# Patient Record
Sex: Female | Born: 1995
Health system: Southern US, Community
[De-identification: ages and names within clinical notes are randomized; demographics above are authoritative.]

## PROBLEM LIST (undated history)

## (undated) DIAGNOSIS — F988 Other specified behavioral and emotional disorders with onset usually occurring in childhood and adolescence: Secondary | ICD-10-CM

## (undated) DIAGNOSIS — J45909 Unspecified asthma, uncomplicated: Secondary | ICD-10-CM

## (undated) DIAGNOSIS — L732 Hidradenitis suppurativa: Secondary | ICD-10-CM

## (undated) HISTORY — DX: Hidradenitis suppurativa: L73.2

## (undated) HISTORY — DX: Unspecified asthma, uncomplicated: J45.909

## (undated) HISTORY — PX: NO PAST SURGERIES: SHX2092

## (undated) HISTORY — DX: Other specified behavioral and emotional disorders with onset usually occurring in childhood and adolescence: F98.8

---
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
## 2013-07-17 ENCOUNTER — Encounter: Payer: Self-pay | Admitting: Internal Medicine

## 2013-08-20 ENCOUNTER — Encounter: Payer: BC Managed Care – PPO | Admitting: Internal Medicine

## 2013-09-14 ENCOUNTER — Ambulatory Visit (INDEPENDENT_AMBULATORY_CARE_PROVIDER_SITE_OTHER): Payer: BC Managed Care – PPO | Admitting: Internal Medicine

## 2013-09-14 ENCOUNTER — Encounter: Payer: Self-pay | Admitting: Internal Medicine

## 2013-09-14 VITALS — BP 110/70 | HR 107 | Temp 98.5°F | Ht 63.0 in | Wt 168.2 lb

## 2013-09-14 DIAGNOSIS — F909 Attention-deficit hyperactivity disorder, unspecified type: Secondary | ICD-10-CM

## 2013-09-14 DIAGNOSIS — Z13220 Encounter for screening for lipoid disorders: Secondary | ICD-10-CM

## 2013-09-14 DIAGNOSIS — R002 Palpitations: Secondary | ICD-10-CM

## 2013-09-14 DIAGNOSIS — R040 Epistaxis: Secondary | ICD-10-CM

## 2013-09-14 LAB — COMPREHENSIVE METABOLIC PANEL
ALK PHOS: 64 U/L (ref 47–119)
ALT: 16 U/L (ref 0–35)
AST: 24 U/L (ref 0–37)
Albumin: 4.2 g/dL (ref 3.5–5.2)
BILIRUBIN TOTAL: 0.5 mg/dL (ref 0.2–1.2)
BUN: 11 mg/dL (ref 6–23)
CO2: 24 meq/L (ref 19–32)
CREATININE: 0.5 mg/dL (ref 0.4–1.2)
Calcium: 9.8 mg/dL (ref 8.4–10.5)
Chloride: 107 mEq/L (ref 96–112)
GFR: 163.89 mL/min (ref >60.00)
Glucose, Bld: 77 mg/dL (ref 70–99)
Potassium: 3.8 mEq/L (ref 3.5–5.1)
Sodium: 140 mEq/L (ref 135–145)
Total Protein: 7.3 g/dL (ref 6.0–8.3)

## 2013-09-14 LAB — LIPID PANEL
CHOL/HDL RATIO: 3
Cholesterol: 159 mg/dL (ref 0–200)
HDL: 50 mg/dL (ref >39.00)
LDL Cholesterol: 102 mg/dL — ABNORMAL HIGH (ref 0–99)
NonHDL: 109
TRIGLYCERIDES: 37 mg/dL (ref 0.0–149.0)
VLDL: 7.4 mg/dL (ref 0.0–40.0)

## 2013-09-14 LAB — CBC WITH DIFFERENTIAL/PLATELET
BASOS ABS: 0.1 10*3/uL (ref 0.0–0.1)
Basophils Relative: 0.8 % (ref 0.0–3.0)
Eosinophils Absolute: 0 10*3/uL (ref 0.0–0.7)
Eosinophils Relative: 0.2 % (ref 0.0–5.0)
HEMATOCRIT: 35.4 % — AB (ref 36.0–49.0)
HEMOGLOBIN: 11.7 g/dL — AB (ref 12.0–16.0)
LYMPHS ABS: 2 10*3/uL (ref 0.7–4.0)
Lymphocytes Relative: 19.3 % — ABNORMAL LOW (ref 24.0–48.0)
MCHC: 33 g/dL (ref 31.0–37.0)
MCV: 81.4 fl (ref 78.0–98.0)
MONO ABS: 0.7 10*3/uL (ref 0.1–1.0)
MONOS PCT: 6.6 % (ref 3.0–12.0)
NEUTROS ABS: 7.4 10*3/uL (ref 1.4–7.7)
Neutrophils Relative %: 73.1 % — ABNORMAL HIGH (ref 43.0–71.0)
PLATELETS: 338 10*3/uL (ref 150.0–575.0)
RBC: 4.35 Mil/uL (ref 3.80–5.70)
RDW: 14.3 % (ref 11.4–15.5)
WBC: 10.1 10*3/uL (ref 4.5–13.5)

## 2013-09-14 LAB — TSH: TSH: 2.31 u[IU]/mL (ref 0.40–5.00)

## 2013-09-14 NOTE — Progress Notes (Signed)
Pre visit review using our clinic review tool, if applicable. No additional management support is needed unless otherwise documented below in the visit note. 

## 2013-09-17 ENCOUNTER — Other Ambulatory Visit: Payer: Self-pay | Admitting: Internal Medicine

## 2013-09-17 ENCOUNTER — Encounter: Payer: Self-pay | Admitting: *Deleted

## 2013-09-17 DIAGNOSIS — D649 Anemia, unspecified: Secondary | ICD-10-CM

## 2013-09-17 NOTE — Progress Notes (Signed)
Orders placed for f/u labs.  

## 2013-09-18 ENCOUNTER — Encounter: Payer: Self-pay | Admitting: Internal Medicine

## 2013-09-18 DIAGNOSIS — R002 Palpitations: Secondary | ICD-10-CM | POA: Insufficient documentation

## 2013-09-18 NOTE — Assessment & Plan Note (Signed)
Appears to be stable.  Is home schooled.

## 2013-09-18 NOTE — Progress Notes (Signed)
  Subjective:    Patient ID: Julie Hester, female    DOB: Dec 20, 1995, 18 y.o.   MRN: [id number]  HPI 18 year old female with past history of Attention Deficit Disorder. She comes in today to follow up on these issues as well as for her complete physical exam.   States she is doing well.  Breathing stable.  No significant problems with allergies or sinus issues.  No acid reflux.  Eating and drinking well.  Watching her diet. Decreased gluten.   Has lost some weight.  Feels better.  No further nose bleeds.  Periods regular.  Last 4-7 days.  LMP ended two days ago.  Overall she feels she is doing well.  Not sexually active.     Past Medical History  Diagnosis Date  . Asthma     As a child  . Attention deficit disorder     Review of Systems Patient denies any headache, lightheadedness or dizziness.  No further nose bleeds.  No chest pain or tightness.  No palpitations.   No increased shortness of breath, cough or congestion.  No nausea or vomiting.  No acid reflux.  No abdominal pain or cramping.  No bowel change, such as diarrhea, constipation, BRBPR or melana.  No urine change.  Periods regular.        Objective:   Physical Exam   Filed Vitals:   09/14/13 1114  BP: 110/70  Pulse: 107  Temp: 98.5 F (4836.649 C)   18 year old female in no acute distress.   HEENT:  Nares- clear.  Oropharynx - without lesions. NECK:  Supple.  Nontender.  No audible bruit.  HEART:  Appears to be regular. LUNGS:  No crackles or wheezing audible.  Respirations even and unlabored.  RADIAL PULSE:  Equal bilaterally.    BREASTS:  No nipple discharge or nipple retraction present.  Could not appreciate any distinct nodules or axillary adenopathy.  ABDOMEN:  Soft, nontender.  Bowel sounds present and normal.  No audible abdominal bruit.  GU:  Not performed.     EXTREMITIES:  No increased edema present.  DP pulses palpable and equal bilaterally.           Assessment & Plan:  CARDIOVASCULAR.  No  palpitations.  Currently asymptomatic.    PULMONARY.  Breathing stable.   WEIGHT LOSS.  Has adjusted her diet.  Lost weight.  Has decreased gluten intake.     HEALTH MAINTENANCE.  Check routine labs.  Physical today.    I spent 25 minutes with the patient and more than 50% of the time was spent in consultation regarding the above.

## 2013-09-18 NOTE — Assessment & Plan Note (Signed)
Not an issue now.  Follow.    

## 2013-10-03 ENCOUNTER — Other Ambulatory Visit: Payer: BC Managed Care – PPO

## 2013-10-03 ENCOUNTER — Other Ambulatory Visit (INDEPENDENT_AMBULATORY_CARE_PROVIDER_SITE_OTHER): Payer: BC Managed Care – PPO

## 2013-10-03 DIAGNOSIS — D649 Anemia, unspecified: Secondary | ICD-10-CM

## 2013-10-03 LAB — CBC WITH DIFFERENTIAL/PLATELET
BASOS ABS: 0 10*3/uL (ref 0.0–0.1)
Basophils Relative: 0.4 % (ref 0.0–3.0)
EOS ABS: 0.1 10*3/uL (ref 0.0–0.7)
Eosinophils Relative: 0.9 % (ref 0.0–5.0)
HCT: 36 % (ref 36.0–49.0)
Hemoglobin: 11.9 g/dL — ABNORMAL LOW (ref 12.0–16.0)
Lymphocytes Relative: 29.3 % (ref 24.0–48.0)
Lymphs Abs: 2.5 10*3/uL (ref 0.7–4.0)
MCHC: 33 g/dL (ref 31.0–37.0)
MCV: 81.6 fl (ref 78.0–98.0)
Monocytes Absolute: 0.6 10*3/uL (ref 0.1–1.0)
Monocytes Relative: 7.2 % (ref 3.0–12.0)
NEUTROS PCT: 62.2 % (ref 43.0–71.0)
Neutro Abs: 5.3 10*3/uL (ref 1.4–7.7)
PLATELETS: 287 10*3/uL (ref 150.0–575.0)
RBC: 4.41 Mil/uL (ref 3.80–5.70)
RDW: 14.4 % (ref 11.4–15.5)
WBC: 8.5 10*3/uL (ref 4.5–13.5)

## 2013-10-04 LAB — IBC PANEL
Iron: 36 ug/dL — ABNORMAL LOW (ref 42–145)
Saturation Ratios: 8 % — ABNORMAL LOW (ref 20.0–50.0)
TRANSFERRIN: 321.1 mg/dL (ref 212.0–360.0)

## 2013-10-04 LAB — FERRITIN: Ferritin: 9 ng/mL — ABNORMAL LOW (ref 10.0–291.0)

## 2013-10-07 ENCOUNTER — Other Ambulatory Visit: Payer: Self-pay | Admitting: Internal Medicine

## 2013-10-07 DIAGNOSIS — D649 Anemia, unspecified: Secondary | ICD-10-CM

## 2013-10-07 NOTE — Progress Notes (Signed)
Order placed for f/u labs.  

## 2013-10-08 ENCOUNTER — Encounter: Payer: Self-pay | Admitting: *Deleted

## 2013-10-18 ENCOUNTER — Ambulatory Visit: Payer: BC Managed Care – PPO | Admitting: Internal Medicine

## 2013-11-19 ENCOUNTER — Encounter: Payer: Self-pay | Admitting: Internal Medicine

## 2013-11-19 ENCOUNTER — Other Ambulatory Visit: Payer: Self-pay | Admitting: Internal Medicine

## 2013-11-19 ENCOUNTER — Other Ambulatory Visit (INDEPENDENT_AMBULATORY_CARE_PROVIDER_SITE_OTHER): Payer: BC Managed Care – PPO

## 2013-11-19 DIAGNOSIS — D649 Anemia, unspecified: Secondary | ICD-10-CM

## 2013-11-19 LAB — CBC WITH DIFFERENTIAL/PLATELET
Basophils Absolute: 0 10*3/uL (ref 0.0–0.1)
Basophils Relative: 0.2 % (ref 0.0–3.0)
EOS ABS: 0.1 10*3/uL (ref 0.0–0.7)
Eosinophils Relative: 1 % (ref 0.0–5.0)
HCT: 35.3 % — ABNORMAL LOW (ref 36.0–49.0)
Hemoglobin: 11.7 g/dL — ABNORMAL LOW (ref 12.0–16.0)
LYMPHS ABS: 2.4 10*3/uL (ref 0.7–4.0)
Lymphocytes Relative: 20.6 % — ABNORMAL LOW (ref 24.0–48.0)
MCHC: 33.3 g/dL (ref 31.0–37.0)
MCV: 82.7 fl (ref 78.0–98.0)
MONO ABS: 0.8 10*3/uL (ref 0.1–1.0)
Monocytes Relative: 6.8 % (ref 3.0–12.0)
NEUTROS PCT: 71.4 % — AB (ref 43.0–71.0)
Neutro Abs: 8.2 10*3/uL — ABNORMAL HIGH (ref 1.4–7.7)
Platelets: 330 10*3/uL (ref 150.0–575.0)
RBC: 4.27 Mil/uL (ref 3.80–5.70)
RDW: 15.1 % (ref 11.4–15.5)
WBC: 11.4 10*3/uL (ref 4.5–13.5)

## 2013-11-19 LAB — FERRITIN: FERRITIN: 15.5 ng/mL (ref 10.0–291.0)

## 2013-11-19 NOTE — Progress Notes (Signed)
Order placed for f/u labs.  

## 2013-11-20 ENCOUNTER — Encounter: Payer: Self-pay | Admitting: *Deleted

## 2013-11-21 NOTE — Telephone Encounter (Signed)
Mailed unread message to pt  

## 2014-01-07 ENCOUNTER — Other Ambulatory Visit (INDEPENDENT_AMBULATORY_CARE_PROVIDER_SITE_OTHER): Payer: BC Managed Care – PPO

## 2014-01-07 DIAGNOSIS — D649 Anemia, unspecified: Secondary | ICD-10-CM

## 2014-01-07 LAB — CBC WITH DIFFERENTIAL/PLATELET
Basophils Absolute: 0 10*3/uL (ref 0.0–0.1)
Basophils Relative: 0.5 % (ref 0.0–3.0)
EOS ABS: 0.1 10*3/uL (ref 0.0–0.7)
EOS PCT: 0.7 % (ref 0.0–5.0)
HCT: 35.8 % — ABNORMAL LOW (ref 36.0–46.0)
Hemoglobin: 12.1 g/dL (ref 12.0–15.0)
Lymphocytes Relative: 25.2 % (ref 12.0–46.0)
Lymphs Abs: 2.4 10*3/uL (ref 0.7–4.0)
MCHC: 33.7 g/dL (ref 30.0–36.0)
MCV: 83.2 fl (ref 78.0–100.0)
MONO ABS: 0.6 10*3/uL (ref 0.1–1.0)
Monocytes Relative: 6.3 % (ref 3.0–12.0)
Neutro Abs: 6.3 10*3/uL (ref 1.4–7.7)
Neutrophils Relative %: 67.3 % (ref 43.0–77.0)
PLATELETS: 336 10*3/uL (ref 150.0–400.0)
RBC: 4.3 Mil/uL (ref 3.87–5.11)
RDW: 15.1 % — ABNORMAL HIGH (ref 11.5–14.6)
WBC: 9.4 10*3/uL (ref 4.5–10.5)

## 2014-01-07 LAB — FERRITIN: FERRITIN: 19.2 ng/mL (ref 10.0–291.0)

## 2014-01-08 ENCOUNTER — Encounter: Payer: Self-pay | Admitting: Internal Medicine

## 2014-01-08 ENCOUNTER — Other Ambulatory Visit: Payer: Self-pay | Admitting: Internal Medicine

## 2014-01-08 ENCOUNTER — Encounter: Payer: Self-pay | Admitting: *Deleted

## 2014-01-08 DIAGNOSIS — D509 Iron deficiency anemia, unspecified: Secondary | ICD-10-CM

## 2014-01-08 NOTE — Progress Notes (Signed)
Orders placed for f/u labs.  

## 2014-03-11 ENCOUNTER — Other Ambulatory Visit (INDEPENDENT_AMBULATORY_CARE_PROVIDER_SITE_OTHER): Payer: BC Managed Care – PPO

## 2014-03-11 DIAGNOSIS — D509 Iron deficiency anemia, unspecified: Secondary | ICD-10-CM

## 2014-03-11 LAB — CBC WITH DIFFERENTIAL/PLATELET
Basophils Absolute: 0 10*3/uL (ref 0.0–0.1)
Basophils Relative: 0.2 % (ref 0.0–3.0)
EOS PCT: 1.4 % (ref 0.0–5.0)
Eosinophils Absolute: 0.1 10*3/uL (ref 0.0–0.7)
HEMATOCRIT: 37.6 % (ref 36.0–46.0)
Hemoglobin: 12.4 g/dL (ref 12.0–15.0)
Lymphocytes Relative: 25 % (ref 12.0–46.0)
Lymphs Abs: 2.6 10*3/uL (ref 0.7–4.0)
MCHC: 33 g/dL (ref 30.0–36.0)
MCV: 84.3 fl (ref 78.0–100.0)
MONOS PCT: 6.6 % (ref 3.0–12.0)
Monocytes Absolute: 0.7 10*3/uL (ref 0.1–1.0)
NEUTROS PCT: 66.8 % (ref 43.0–77.0)
Neutro Abs: 6.8 10*3/uL (ref 1.4–7.7)
Platelets: 348 10*3/uL (ref 150.0–400.0)
RBC: 4.46 Mil/uL (ref 3.87–5.11)
RDW: 14.5 % (ref 11.5–14.6)
WBC: 10.3 10*3/uL (ref 4.5–10.5)

## 2014-03-11 LAB — FERRITIN: FERRITIN: 19.2 ng/mL (ref 10.0–291.0)

## 2014-03-12 ENCOUNTER — Telehealth: Payer: Self-pay | Admitting: Internal Medicine

## 2014-03-12 ENCOUNTER — Encounter: Payer: Self-pay | Admitting: Internal Medicine

## 2014-03-12 NOTE — Telephone Encounter (Signed)
Pt needs a physical scheduled after 09/15/14.  Thanks.

## 2014-03-15 NOTE — Telephone Encounter (Signed)
Unread mychart message mailed to patient 

## 2014-09-17 ENCOUNTER — Encounter: Payer: Self-pay | Admitting: Internal Medicine

## 2014-09-17 ENCOUNTER — Ambulatory Visit (INDEPENDENT_AMBULATORY_CARE_PROVIDER_SITE_OTHER): Payer: BLUE CROSS/BLUE SHIELD | Admitting: Internal Medicine

## 2014-09-17 VITALS — BP 110/60 | HR 81 | Temp 98.4°F | Ht 63.5 in | Wt 183.0 lb

## 2014-09-17 DIAGNOSIS — Z0000 Encounter for general adult medical examination without abnormal findings: Secondary | ICD-10-CM | POA: Diagnosis not present

## 2014-09-17 DIAGNOSIS — D509 Iron deficiency anemia, unspecified: Secondary | ICD-10-CM | POA: Insufficient documentation

## 2014-09-17 DIAGNOSIS — Z13220 Encounter for screening for lipoid disorders: Secondary | ICD-10-CM

## 2014-09-17 DIAGNOSIS — F909 Attention-deficit hyperactivity disorder, unspecified type: Secondary | ICD-10-CM

## 2014-09-17 DIAGNOSIS — R002 Palpitations: Secondary | ICD-10-CM

## 2014-09-17 DIAGNOSIS — R040 Epistaxis: Secondary | ICD-10-CM | POA: Diagnosis not present

## 2014-09-17 LAB — COMPREHENSIVE METABOLIC PANEL
ALT: 16 U/L (ref 0–35)
AST: 20 U/L (ref 0–37)
Albumin: 4.4 g/dL (ref 3.5–5.2)
Alkaline Phosphatase: 64 U/L (ref 39–117)
BUN: 9 mg/dL (ref 6–23)
CHLORIDE: 104 meq/L (ref 96–112)
CO2: 26 mEq/L (ref 19–32)
CREATININE: 0.54 mg/dL (ref 0.40–1.20)
Calcium: 9.7 mg/dL (ref 8.4–10.5)
GFR: 151.88 mL/min (ref >60.00)
GLUCOSE: 87 mg/dL (ref 70–99)
Potassium: 4 mEq/L (ref 3.5–5.1)
Sodium: 137 mEq/L (ref 135–145)
Total Bilirubin: 0.5 mg/dL (ref 0.2–1.2)
Total Protein: 6.9 g/dL (ref 6.0–8.3)

## 2014-09-17 LAB — CBC WITH DIFFERENTIAL/PLATELET
Basophils Absolute: 0 10*3/uL (ref 0.0–0.1)
Basophils Relative: 0.3 % (ref 0.0–3.0)
EOS ABS: 0 10*3/uL (ref 0.0–0.7)
Eosinophils Relative: 0.6 % (ref 0.0–5.0)
HCT: 37.3 % (ref 36.0–46.0)
Hemoglobin: 12.5 g/dL (ref 12.0–15.0)
LYMPHS PCT: 20.5 % (ref 12.0–46.0)
Lymphs Abs: 1.8 10*3/uL (ref 0.7–4.0)
MCHC: 33.6 g/dL (ref 30.0–36.0)
MCV: 84.5 fl (ref 78.0–100.0)
Monocytes Absolute: 0.6 10*3/uL (ref 0.1–1.0)
Monocytes Relative: 7.1 % (ref 3.0–12.0)
NEUTROS ABS: 6.2 10*3/uL (ref 1.4–7.7)
Neutrophils Relative %: 71.5 % (ref 43.0–77.0)
PLATELETS: 317 10*3/uL (ref 150.0–400.0)
RBC: 4.42 Mil/uL (ref 3.87–5.11)
RDW: 14.7 % — AB (ref 11.5–14.6)
WBC: 8.6 10*3/uL (ref 4.5–10.5)

## 2014-09-17 LAB — LIPID PANEL
Cholesterol: 149 mg/dL (ref 0–200)
HDL: 48.9 mg/dL (ref >39.00)
LDL CALC: 85 mg/dL (ref 0–99)
NONHDL: 100.1
Total CHOL/HDL Ratio: 3
Triglycerides: 78 mg/dL (ref 0.0–149.0)
VLDL: 15.6 mg/dL (ref 0.0–40.0)

## 2014-09-17 LAB — TSH: TSH: 2.5 u[IU]/mL (ref 0.35–5.50)

## 2014-09-17 LAB — FERRITIN: Ferritin: 20.2 ng/mL (ref 10.0–291.0)

## 2014-09-17 NOTE — Progress Notes (Signed)
Pre visit review using our clinic review tool, if applicable. No additional management support is needed unless otherwise documented below in the visit note. 

## 2014-09-17 NOTE — Progress Notes (Signed)
Patient ID: Julie Hester, female   DOB: 1995-12-30, 19 y.o.   MRN: [id number]   Subjective:    Patient ID: Julie Hester, female    DOB: 1995-06-24, 19 y.o.   MRN: [id number]  HPI  Patient here to follow up on her current medical issues as well as for a complete physical exam.  She is two months away from getting her high school diploma.  She tries to stay active.  Started exercising recently.  No cardiac symptoms with increased activity or exertion.  Breathing stable.  Bowels stable.  LMP 09/11/14.  Regular.  No spotting.  Not sexually active.     Past Medical History  Diagnosis Date  . Asthma     As a child  . Attention deficit disorder     Review of Systems  Constitutional: Negative for appetite change and unexpected weight change.  HENT: Negative for congestion and sinus pressure.   Eyes: Negative for pain and visual disturbance.  Respiratory: Negative for cough, chest tightness and shortness of breath.   Cardiovascular: Negative for chest pain, palpitations and leg swelling.  Gastrointestinal: Negative for nausea, vomiting, abdominal pain and diarrhea.  Genitourinary: Negative for dysuria and difficulty urinating.  Musculoskeletal: Negative for back pain and joint swelling.  Skin: Negative for color change and rash.  Neurological: Negative for dizziness, light-headedness and headaches.  Hematological: Negative for adenopathy. Does not bruise/bleed easily.  Psychiatric/Behavioral: Negative for dysphoric mood and agitation.       Objective:    Physical Exam  Constitutional: She is oriented to person, place, and time. She appears well-developed and well-nourished.  HENT:  Nose: Nose normal.  Mouth/Throat: Oropharynx is clear and moist.  Eyes: Right eye exhibits no discharge. Left eye exhibits no discharge. No scleral icterus.  Neck: Neck supple. No thyromegaly present.  Cardiovascular: Normal rate and regular rhythm.   Pulmonary/Chest: Breath sounds normal. No  accessory muscle usage. No tachypnea. No respiratory distress. She has no decreased breath sounds. She has no wheezes. She has no rhonchi. Right breast exhibits no inverted nipple, no mass, no nipple discharge and no tenderness (no axillary adenopathy). Left breast exhibits no inverted nipple, no mass, no nipple discharge and no tenderness (no axilarry adenopathy).  Abdominal: Soft. Bowel sounds are normal. There is no tenderness.  Musculoskeletal: She exhibits no edema or tenderness.  Lymphadenopathy:    She has no cervical adenopathy.  Neurological: She is alert and oriented to person, place, and time.  Skin: Skin is warm. No rash noted.  Psychiatric: She has a normal mood and affect. Her behavior is normal.    BP 110/60 mmHg  Pulse 81  Temp(Src) 98.4 F (36.9 C) (Oral)  Ht 5' 3.5" (1.613 m)  Wt 183 lb (83.008 kg)  BMI 31.90 kg/m2  SpO2 98%  LMP 09/11/2014 (Exact Date) Wt Readings from Last 3 Encounters:  09/17/14 183 lb (83.008 kg)  09/14/13 168 lb 4 oz (76.318 kg) (91 %*, Z = 1.35)  08/17/12 156 lb 8 oz (70.988 kg) (87 %*, Z = 1.13)   * Growth percentiles are based on CDC 2-20 Years data.     Lab Results  Component Value Date   WBC 8.6 09/17/2014   HGB 12.5 09/17/2014   HCT 37.3 09/17/2014   PLT 317.0 09/17/2014   GLUCOSE 87 09/17/2014   CHOL 149 09/17/2014   TRIG 78.0 09/17/2014   HDL 48.90 09/17/2014   LDLCALC 85 09/17/2014   ALT 16 09/17/2014   AST 20 09/17/2014  NA 137 09/17/2014   K 4.0 09/17/2014   CL 104 09/17/2014   CREATININE 0.54 09/17/2014   BUN 9 09/17/2014   CO2 26 09/17/2014   TSH 2.50 09/17/2014   HGBA1C  07/09/2008    5.0 (NOTE)   The ADA recommends the following therapeutic goal for glycemic   control related to Hgb A1C measurement:   Goal of Therapy:   < 7.0% Hgb A1C   Reference: American Diabetes Association: Clinical Practice   Recommendations 2008, Diabetes Care,  2008, 31:(Suppl 1).       Assessment & Plan:   Problem List Items  Addressed This Visit    Attention deficit hyperactivity disorder    Is home schooled.  Stable.  Follow.  Planning to finish with high school courses soon.        Epistaxis    Reports no further nose bleeds.  Will notify me if a problem.        Health care maintenance    Physical today.  Wanted to postpone her pap until next year.  Check cholesterol with next fasting labs.       Iron deficiency anemia - Primary    Recheck cbc and ferritin with next labs.  Was on iron.        Relevant Orders   CBC with Differential/Platelet (Completed)   Comprehensive metabolic panel (Completed)   TSH (Completed)   Ferritin (Completed)   Palpitations    Not an issue for her now.  Follow.         Other Visit Diagnoses    Screening cholesterol level        Relevant Orders    Lipid panel (Completed)        Dale [place], MD

## 2014-09-18 ENCOUNTER — Encounter: Payer: Self-pay | Admitting: Internal Medicine

## 2014-09-20 NOTE — Telephone Encounter (Signed)
Unread mychart message mailed to patient 

## 2014-09-24 ENCOUNTER — Encounter: Payer: Self-pay | Admitting: Internal Medicine

## 2014-09-24 DIAGNOSIS — Z0000 Encounter for general adult medical examination without abnormal findings: Secondary | ICD-10-CM | POA: Insufficient documentation

## 2014-09-24 NOTE — Assessment & Plan Note (Signed)
Physical today.  Wanted to postpone her pap until next year.  Check cholesterol with next fasting labs.

## 2014-09-24 NOTE — Assessment & Plan Note (Signed)
Reports no further nose bleeds.  Will notify me if a problem.

## 2014-09-24 NOTE — Assessment & Plan Note (Signed)
Is home schooled.  Stable.  Follow.  Planning to finish with high school courses soon.

## 2014-09-24 NOTE — Assessment & Plan Note (Signed)
Not an issue for her now.  Follow.  

## 2014-09-24 NOTE — Assessment & Plan Note (Signed)
Recheck cbc and ferritin with next labs.  Was on iron.

## 2015-02-10 ENCOUNTER — Other Ambulatory Visit: Payer: Self-pay | Admitting: Internal Medicine

## 2015-02-10 MED ORDER — ALPRAZOLAM 0.25 MG PO TABS: 0.2500 mg | ORAL_TABLET | Freq: Every day | ORAL | Status: DC | PRN

## 2015-02-10 NOTE — Progress Notes (Signed)
rx sent in at pt's mother's request for xanax.  Taking without problems.

## 2015-06-26 ENCOUNTER — Ambulatory Visit (INDEPENDENT_AMBULATORY_CARE_PROVIDER_SITE_OTHER): Payer: BLUE CROSS/BLUE SHIELD | Admitting: Internal Medicine

## 2015-06-26 ENCOUNTER — Encounter: Payer: Self-pay | Admitting: Internal Medicine

## 2015-06-26 VITALS — BP 110/70 | HR 86 | Temp 97.5°F | Resp 18 | Ht 63.5 in | Wt 181.1 lb

## 2015-06-26 DIAGNOSIS — F419 Anxiety disorder, unspecified: Secondary | ICD-10-CM | POA: Diagnosis not present
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
## 2015-06-26 NOTE — Progress Notes (Signed)
Pre-visit discussion using our clinic review tool. No additional management support is needed unless otherwise documented below in the visit note.  

## 2015-06-28 ENCOUNTER — Encounter: Payer: Self-pay | Admitting: Internal Medicine

## 2015-06-28 DIAGNOSIS — F32A Depression, unspecified: Secondary | ICD-10-CM | POA: Insufficient documentation

## 2015-06-28 DIAGNOSIS — F339 Major depressive disorder, recurrent, unspecified: Secondary | ICD-10-CM | POA: Insufficient documentation

## 2015-06-28 DIAGNOSIS — F419 Anxiety disorder, unspecified: Secondary | ICD-10-CM | POA: Insufficient documentation

## 2015-06-28 NOTE — Assessment & Plan Note (Signed)
Increased anxiety as outlined.  Seeing a counselor weekly.  Feels has PTSD.  Discussed the need for psychiatry referral.  She declines at this time.  Discussed her current issues and anxiety at length.  Discussed treatment options.  She is due to see her counselor today.  Will d/w her.  Will have her counselor call me and we will discuss treatment options.  Pt and mother comfortable with this plan.  No suicidal ideations.

## 2016-03-22 ENCOUNTER — Encounter: Payer: Self-pay | Admitting: Family

## 2016-03-22 ENCOUNTER — Ambulatory Visit (INDEPENDENT_AMBULATORY_CARE_PROVIDER_SITE_OTHER): Payer: BLUE CROSS/BLUE SHIELD | Admitting: Family

## 2016-03-22 VITALS — BP 120/62 | HR 91 | Temp 98.6°F | Ht 63.5 in | Wt 198.8 lb

## 2016-03-22 DIAGNOSIS — F419 Anxiety disorder, unspecified: Secondary | ICD-10-CM

## 2016-03-22 DIAGNOSIS — D509 Iron deficiency anemia, unspecified: Secondary | ICD-10-CM

## 2016-03-22 MED ORDER — SERTRALINE HCL 50 MG PO TABS: 50.0000 mg | ORAL_TABLET | Freq: Every day | ORAL | 0 refills | Status: DC

## 2016-03-22 NOTE — Patient Instructions (Addendum)
Follow up in one week  I would [facility] for [facility] to [facility] anytime between 8am-3pm.  They have counseling and psychiatrists there who can see you right away.  They have walk ins on Monday, Wednesdays, and Fridays.  The [place] [address] [place], [place] [address].  Phone number is [phone].   [place] [facility] and [facility].   Please know that I am thinking about you.   Our hope is for gradual improvement of mood since starting medication; however this may take several weeks.   If you start to have unusual thoughts, thoughts of hurting yourself, or anyone else, please go immediately to the emergency department.    National Suicide Prevention Hotline - available 24 hours a day, 7 days a week.  (780)060-95051-512-253-5659  Major Depressive Disorder Major depressive disorder is a mental illness. It also may be called clinical depression or unipolar depression. Major depressive disorder usually causes feelings of sadness, hopelessness, or helplessness. Some people with this disorder do not feel particularly sad but lose interest in doing things they used to enjoy (anhedonia). Major depressive disorder also can cause physical symptoms. It can interfere with work, school, relationships, and other normal everyday activities. The disorder varies in severity but is longer lasting and more serious than the sadness we all feel from time to time in our lives. Major depressive disorder often is triggered by stressful life events or major life changes. Examples of these triggers include divorce, loss of your job or home, a move, and the death of a family member or close friend. Sometimes this disorder occurs for no obvious reason at all. People who have family members with major depressive disorder or bipolar disorder are at higher risk for developing this disorder, with or without life stressors. Major depressive disorder can occur at any age. It may occur just  once in your life (single episode major depressive disorder). It may occur multiple times (recurrent major depressive disorder). SYMPTOMS People with major depressive disorder have either anhedonia or depressed mood on nearly a daily basis for at least 2 weeks or longer. Symptoms of depressed mood include:  Feelings of sadness (blue or down in the dumps) or emptiness.  Feelings of hopelessness or helplessness.  Tearfulness or episodes of crying (may be observed by others).  Irritability (children and adolescents). In addition to depressed mood or anhedonia or both, people with this disorder have at least four of the following symptoms:  Difficulty sleeping or sleeping too much.   Significant change (increase or decrease) in appetite or weight.   Lack of energy or motivation.  Feelings of guilt and worthlessness.   Difficulty concentrating, remembering, or making decisions.  Unusually slow movement (psychomotor retardation) or restlessness (as observed by others).   Recurrent wishes for death, recurrent thoughts of self-harm (suicide), or a suicide attempt. People with major depressive disorder commonly have persistent negative thoughts about themselves, other people, and the world. People with severe major depressive disorder may experiencedistorted beliefs or perceptions about the world (psychotic delusions). They also may see or hear things that are not real (psychotic hallucinations). DIAGNOSIS Major depressive disorder is diagnosed through an assessment by your health care provider. Your health care provider will ask aboutaspects of your daily life, such as mood,sleep, and appetite, to see if you have the diagnostic symptoms of major depressive disorder. Your health care provider may ask about your medical history and use of alcohol or drugs, including  prescription medicines. Your health care provider also may do a physical exam and blood work. This is because certain medical  conditions and the use of certain substances can cause major depressive disorder-like symptoms (secondary depression). Your health care provider also may refer you to a mental health specialist for further evaluation and treatment. TREATMENT It is important to recognize the symptoms of major depressive disorder and seek treatment. The following treatments can be prescribed for this disorder:   Medicine. Antidepressant medicines usually are prescribed. Antidepressant medicines are thought to correct chemical imbalances in the brain that are commonly associated with major depressive disorder. Other types of medicine may be added if the symptoms do not respond to antidepressant medicines alone or if psychotic delusions or hallucinations occur.  Talk therapy. Talk therapy can be helpful in treating major depressive disorder by providing support, education, and guidance. Certain types of talk therapy also can help with negative thinking (cognitive behavioral therapy) and with relationship issues that trigger this disorder (interpersonal therapy). A mental health specialist can help determine which treatment is best for you. Most people with major depressive disorder do well with a combination of medicine and talk therapy. Treatments involving electrical stimulation of the brain can be used in situations with extremely severe symptoms or when medicine and talk therapy do not work over time. These treatments include electroconvulsive therapy, transcranial magnetic stimulation, and vagal nerve stimulation.   This information is not intended to replace advice given to you by your health care provider. Make sure you discuss any questions you have with your health care provider.   Document Released: 07/17/2012 Document Revised: 04/12/2014 Document Reviewed: 07/17/2012 Elsevier Interactive Patient Education [employer]2016 Elsevier Inc.

## 2016-03-22 NOTE — Assessment & Plan Note (Signed)
Pending lab work .

## 2016-03-22 NOTE — Progress Notes (Signed)
Pre visit review using our clinic review tool, if applicable. No additional management support is needed unless otherwise documented below in the visit note. 

## 2016-03-22 NOTE — Assessment & Plan Note (Addendum)
Worsening. Patient and mother preference to go ahead and start trial zoloft for GAD, PTSD. Discussed side effects of this medication, including suidicidality at great length. Also given information for walk in [facility] services for immediate psychiatrist consult due to complex h/o nightmares, PTSD. Follow up in week.
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
## 2016-03-23 LAB — CBC WITH DIFFERENTIAL/PLATELET
BASOS ABS: 0 10*3/uL (ref 0.0–0.1)
Basophils Relative: 0.2 % (ref 0.0–3.0)
EOS ABS: 0 10*3/uL (ref 0.0–0.7)
EOS PCT: 0.3 % (ref 0.0–5.0)
HCT: 38.9 % (ref 36.0–46.0)
HEMOGLOBIN: 12.8 g/dL (ref 12.0–15.0)
Lymphocytes Relative: 14.7 % (ref 12.0–46.0)
Lymphs Abs: 1.8 10*3/uL (ref 0.7–4.0)
MCHC: 32.9 g/dL (ref 30.0–36.0)
MCV: 83.4 fl (ref 78.0–100.0)
MONO ABS: 0.6 10*3/uL (ref 0.1–1.0)
Monocytes Relative: 5 % (ref 3.0–12.0)
NEUTROS PCT: 79.8 % — AB (ref 43.0–77.0)
Neutro Abs: 9.9 10*3/uL — ABNORMAL HIGH (ref 1.4–7.7)
Platelets: 391 10*3/uL (ref 150.0–400.0)
RBC: 4.66 Mil/uL (ref 3.87–5.11)
RDW: 14.4 % (ref 11.5–15.5)
WBC: 12.4 10*3/uL — AB (ref 4.0–10.5)

## 2016-03-23 LAB — B12 AND FOLATE PANEL
FOLATE: 12.4 ng/mL (ref >5.9)
VITAMIN B 12: 280 pg/mL (ref 211–911)

## 2016-04-01 ENCOUNTER — Ambulatory Visit: Payer: BLUE CROSS/BLUE SHIELD | Admitting: Family

## 2016-07-22 ENCOUNTER — Ambulatory Visit (INDEPENDENT_AMBULATORY_CARE_PROVIDER_SITE_OTHER): Payer: BLUE CROSS/BLUE SHIELD | Admitting: Internal Medicine

## 2016-07-22 ENCOUNTER — Encounter: Payer: Self-pay | Admitting: Internal Medicine

## 2016-07-22 VITALS — BP 120/80 | HR 86 | Temp 98.6°F | Resp 12 | Ht 64.0 in | Wt 211.0 lb

## 2016-07-22 DIAGNOSIS — R51 Headache: Secondary | ICD-10-CM | POA: Diagnosis not present

## 2016-07-22 DIAGNOSIS — D509 Iron deficiency anemia, unspecified: Secondary | ICD-10-CM

## 2016-07-22 DIAGNOSIS — R519 Headache, unspecified: Secondary | ICD-10-CM

## 2016-07-22 DIAGNOSIS — F419 Anxiety disorder, unspecified: Secondary | ICD-10-CM | POA: Diagnosis not present

## 2016-07-22 MED ORDER — BUSPIRONE HCL 5 MG PO TABS: 5.0000 mg | ORAL_TABLET | Freq: Every day | ORAL | 0 refills | Status: DC

## 2016-07-22 NOTE — Progress Notes (Signed)
Pre-visit discussion using our clinic review tool. No additional management support is needed unless otherwise documented below in the visit note.  

## 2016-07-22 NOTE — Progress Notes (Signed)
Patient ID: Julie Hester, female   DOB: Feb 07, 1996, 21 y.o.   MRN: [id number]   Subjective:    Patient ID: Julie Hester, female    DOB: Mar 14, 1996, 21 y.o.   MRN: [id number]  HPI  Patient here for a scheduled follow up.  She reports increased anxiety.  Has been under increased stress with family issues.  Discussed at length with her today.  Discussed psychiatry referral.  She is reluctant to take SSRIs.  Discussed buspar.  She was in agreement to try this.  She is eating.  Discussed diet and exercise. Bowels moving.  She does report having migraine headaches.  She reports one every 1-2 weeks.  Describes as feeling tight - frontal region.  Some nausea. Sensitive to sound.  Started 1-2 months ago.  She feels is related to increased stress.  Discussed with her regarding further w/up for headache, including scan.  She declines.     Past Medical History:  Diagnosis Date  . Asthma    As a child  . Attention deficit disorder    History reviewed. No pertinent surgical history. Family History  Problem Relation Age of Onset  . Arthritis Mother   . Diabetes Father   . Cancer Maternal Grandmother     lung  . Cancer Maternal Grandfather     lung  . Cancer Paternal Grandmother     lung  . Cancer Paternal Grandfather     lung   Social History   Social History  . Marital status: Single    Spouse name: N/A  . Number of children: N/A  . Years of education: N/A   Social History Main Topics  . Smoking status: Former Smoker    Types: Cigarettes  . Smokeless tobacco: Never Used  . Alcohol use No  . Drug use: No  . Sexual activity: No   Other Topics Concern  . None   Social History Narrative  . None    Outpatient Encounter Prescriptions as of 07/22/2016  Medication Sig  . ALPRAZolam (XANAX) 0.25 MG tablet Take 1 tablet (0.25 mg total) by mouth daily as needed for anxiety.  . busPIRone (BUSPAR) 5 MG tablet Take 1 tablet (5 mg total) by mouth at bedtime.  . [DISCONTINUED]  sertraline (ZOLOFT) 50 MG tablet Take 1 tablet (50 mg total) by mouth at bedtime. (Patient not taking: Reported on 07/22/2016)   No facility-administered encounter medications on file as of 07/22/2016.     Review of Systems  Constitutional: Negative for appetite change and unexpected weight change.  HENT: Negative for congestion and sinus pressure.   Respiratory: Negative for cough, chest tightness and shortness of breath.   Cardiovascular: Negative for chest pain, palpitations and leg swelling.  Gastrointestinal: Negative for abdominal pain, diarrhea and vomiting.       Some nausea related to headaches.   Genitourinary: Negative for difficulty urinating and dysuria.  Musculoskeletal: Negative for back pain and joint swelling.  Skin: Negative for color change and rash.  Neurological: Positive for headaches. Negative for dizziness.  Psychiatric/Behavioral:       Increased anxiety and stress as outlined.         Objective:    Physical Exam  Constitutional: She appears well-developed and well-nourished. No distress.  HENT:  Nose: Nose normal.  Mouth/Throat: Oropharynx is clear and moist.  Neck: Neck supple. No thyromegaly present.  Cardiovascular: Normal rate and regular rhythm.   Pulmonary/Chest: Breath sounds normal. No respiratory distress. She has no wheezes.  Abdominal:  Soft. Bowel sounds are normal. There is no tenderness.  Musculoskeletal: She exhibits no edema or tenderness.  Lymphadenopathy:    She has no cervical adenopathy.  Skin: No rash noted. No erythema.  Psychiatric: She has a normal mood and affect.    BP 120/80 (BP Location: Left Arm, Patient Position: Sitting, Cuff Size: Normal)   Pulse 86   Temp 98.6 F (37 C) (Oral)   Resp 12   Ht  (1.626 m)   Wt 211 lb (95.7 kg)   LMP 07/05/2016 (Approximate)   SpO2 98%   BMI 36.22 kg/m  Wt Readings from Last 3 Encounters:  07/22/16 211 lb (95.7 kg)  03/22/16 198 lb 12.8 oz (90.2 kg)  06/26/15 181 lb 2 oz  (82.2 kg)     Lab Results  Component Value Date   WBC 11.0 (H) 07/22/2016   HGB 12.7 07/22/2016   HCT 38.8 07/22/2016   PLT 376.0 07/22/2016   GLUCOSE 87 07/22/2016   CHOL 149 09/17/2014   TRIG 78.0 09/17/2014   HDL 48.90 09/17/2014   LDLCALC 85 09/17/2014   ALT 19 07/22/2016   AST 19 07/22/2016   NA 137 07/22/2016   K 4.1 07/22/2016   CL 103 07/22/2016   CREATININE 0.62 07/22/2016   BUN 9 07/22/2016   CO2 26 07/22/2016   TSH 3.05 07/22/2016   HGBA1C  07/09/2008    5.0 (NOTE)   The ADA recommends the following therapeutic goal for glycemic   control related to Hgb A1C measurement:   Goal of Therapy:   < 7.0% Hgb A1C   Reference: American Diabetes Association: Clinical Practice   Recommendations 2008, Diabetes Care,  2008, 31:(Suppl 1).       Assessment & Plan:   Problem List Items Addressed This Visit    Anxiety - Primary    Increased stress and anxiety as outlined.  Discussed with her today.  She is reluctant to take SSRIs.  Discussed buspar.  She is agreeable.  Also discussed psychiatry referral.  She reluctantly agreed.  Will place order for referral.        Relevant Medications   busPIRone (BUSPAR) 5 MG tablet   Other Relevant Orders   CBC with Differential/Platelet (Completed)   Comprehensive metabolic panel (Completed)   TSH (Completed)   Ambulatory referral to Psychiatry   Headache    Persistent intermittent headaches as outlined.  Discussed further evaluation and testing with her.  She declines.  Wants to monitor.  Will notify me if she changes her mind.        Iron deficiency anemia    Recheck cbc.            Dale Hunter, MD

## 2016-07-23 LAB — COMPREHENSIVE METABOLIC PANEL
ALT: 19 U/L (ref 0–35)
AST: 19 U/L (ref 0–37)
Albumin: 4.4 g/dL (ref 3.5–5.2)
Alkaline Phosphatase: 67 U/L (ref 39–117)
BILIRUBIN TOTAL: 0.4 mg/dL (ref 0.2–1.2)
BUN: 9 mg/dL (ref 6–23)
CHLORIDE: 103 meq/L (ref 96–112)
CO2: 26 meq/L (ref 19–32)
CREATININE: 0.62 mg/dL (ref 0.40–1.20)
Calcium: 9.8 mg/dL (ref 8.4–10.5)
GFR: 127.27 mL/min (ref >60.00)
Glucose, Bld: 87 mg/dL (ref 70–99)
Potassium: 4.1 mEq/L (ref 3.5–5.1)
SODIUM: 137 meq/L (ref 135–145)
Total Protein: 7.7 g/dL (ref 6.0–8.3)

## 2016-07-23 LAB — CBC WITH DIFFERENTIAL/PLATELET
Basophils Absolute: 0.3 10*3/uL — ABNORMAL HIGH (ref 0.0–0.1)
Basophils Relative: 2.7 % (ref 0.0–3.0)
EOS ABS: 0.1 10*3/uL (ref 0.0–0.7)
Eosinophils Relative: 0.7 % (ref 0.0–5.0)
HCT: 38.8 % (ref 36.0–46.0)
Hemoglobin: 12.7 g/dL (ref 12.0–15.0)
Lymphocytes Relative: 20.6 % (ref 12.0–46.0)
Lymphs Abs: 2.3 10*3/uL (ref 0.7–4.0)
MCHC: 32.8 g/dL (ref 30.0–36.0)
MCV: 85.8 fl (ref 78.0–100.0)
MONO ABS: 0.8 10*3/uL (ref 0.1–1.0)
Monocytes Relative: 7.4 % (ref 3.0–12.0)
NEUTROS ABS: 7.5 10*3/uL (ref 1.4–7.7)
Neutrophils Relative %: 68.6 % (ref 43.0–77.0)
PLATELETS: 376 10*3/uL (ref 150.0–400.0)
RBC: 4.52 Mil/uL (ref 3.87–5.11)
RDW: 14.4 % (ref 11.5–15.5)
WBC: 11 10*3/uL — ABNORMAL HIGH (ref 4.0–10.5)

## 2016-07-23 LAB — TSH: TSH: 3.05 u[IU]/mL (ref 0.35–4.50)

## 2016-07-25 ENCOUNTER — Other Ambulatory Visit: Payer: Self-pay | Admitting: Internal Medicine

## 2016-07-25 DIAGNOSIS — D72829 Elevated white blood cell count, unspecified: Secondary | ICD-10-CM

## 2016-07-25 NOTE — Progress Notes (Signed)
Order placed for f/u cbc.   

## 2016-07-26 ENCOUNTER — Telehealth: Payer: Self-pay

## 2016-07-26 NOTE — Telephone Encounter (Signed)
Left message to return call to our office.  

## 2016-07-26 NOTE — Telephone Encounter (Signed)
-----   Message from Dale [place], MD sent at 07/25/2016  8:33 AM EDT ----- Please call and notify pt that her white blood cell is slightly elevated.  This can just be a variant.  We will need to follow.  Hgb, thyroid test, kidney function tests and liver function tests are wnl.  Recheck cbc in one month.

## 2016-07-26 NOTE — Telephone Encounter (Signed)
Spoke to patient informed of lab app. Will call if any questions

## 2016-08-02 ENCOUNTER — Encounter: Payer: Self-pay | Admitting: Internal Medicine

## 2016-08-02 DIAGNOSIS — R51 Headache: Secondary | ICD-10-CM

## 2016-08-02 DIAGNOSIS — R519 Headache, unspecified: Secondary | ICD-10-CM | POA: Insufficient documentation

## 2016-08-02 NOTE — Assessment & Plan Note (Signed)
Recheck cbc.  

## 2016-08-02 NOTE — Assessment & Plan Note (Signed)
Increased stress and anxiety as outlined.  Discussed with her today.  She is reluctant to take SSRIs.  Discussed buspar.  She is agreeable.  Also discussed psychiatry referral.  She reluctantly agreed.  Will place order for referral.

## 2016-08-02 NOTE — Assessment & Plan Note (Signed)
Persistent intermittent headaches as outlined.  Discussed further evaluation and testing with her.  She declines.  Wants to monitor.  Will notify me if she changes her mind.

## 2016-08-25 ENCOUNTER — Other Ambulatory Visit (INDEPENDENT_AMBULATORY_CARE_PROVIDER_SITE_OTHER): Payer: BLUE CROSS/BLUE SHIELD

## 2016-08-25 DIAGNOSIS — D72829 Elevated white blood cell count, unspecified: Secondary | ICD-10-CM

## 2016-08-25 LAB — CBC WITH DIFFERENTIAL/PLATELET
BASOS ABS: 0.1 10*3/uL (ref 0.0–0.1)
Basophils Relative: 0.5 % (ref 0.0–3.0)
EOS ABS: 0.1 10*3/uL (ref 0.0–0.7)
Eosinophils Relative: 1 % (ref 0.0–5.0)
HCT: 37.3 % (ref 36.0–46.0)
HEMOGLOBIN: 12.2 g/dL (ref 12.0–15.0)
Lymphocytes Relative: 25.1 % (ref 12.0–46.0)
Lymphs Abs: 2.7 10*3/uL (ref 0.7–4.0)
MCHC: 32.8 g/dL (ref 30.0–36.0)
MCV: 85.4 fl (ref 78.0–100.0)
MONO ABS: 0.8 10*3/uL (ref 0.1–1.0)
Monocytes Relative: 7.5 % (ref 3.0–12.0)
Neutro Abs: 7.1 10*3/uL (ref 1.4–7.7)
Neutrophils Relative %: 65.9 % (ref 43.0–77.0)
Platelets: 352 10*3/uL (ref 150.0–400.0)
RBC: 4.37 Mil/uL (ref 3.87–5.11)
RDW: 14.7 % (ref 11.5–15.5)
WBC: 10.8 10*3/uL — AB (ref 4.0–10.5)

## 2016-08-26 ENCOUNTER — Encounter: Payer: Self-pay | Admitting: Internal Medicine

## 2016-09-29 ENCOUNTER — Ambulatory Visit: Payer: BLUE CROSS/BLUE SHIELD | Admitting: Internal Medicine

## 2016-12-13 ENCOUNTER — Ambulatory Visit: Payer: BLUE CROSS/BLUE SHIELD | Admitting: Internal Medicine

## 2017-01-28 ENCOUNTER — Encounter: Payer: Self-pay | Admitting: Internal Medicine

## 2017-01-28 ENCOUNTER — Ambulatory Visit (INDEPENDENT_AMBULATORY_CARE_PROVIDER_SITE_OTHER): Payer: BLUE CROSS/BLUE SHIELD | Admitting: Internal Medicine

## 2017-01-28 VITALS — BP 126/84 | HR 90 | Temp 98.6°F | Resp 14 | Ht 64.0 in | Wt 215.0 lb

## 2017-01-28 DIAGNOSIS — Z0000 Encounter for general adult medical examination without abnormal findings: Secondary | ICD-10-CM

## 2017-01-28 DIAGNOSIS — D509 Iron deficiency anemia, unspecified: Secondary | ICD-10-CM | POA: Diagnosis not present

## 2017-01-28 DIAGNOSIS — F419 Anxiety disorder, unspecified: Secondary | ICD-10-CM | POA: Diagnosis not present

## 2017-01-28 DIAGNOSIS — L732 Hidradenitis suppurativa: Secondary | ICD-10-CM | POA: Diagnosis not present

## 2017-01-28 DIAGNOSIS — F909 Attention-deficit hyperactivity disorder, unspecified type: Secondary | ICD-10-CM | POA: Diagnosis not present

## 2017-01-28 DIAGNOSIS — Z0001 Encounter for general adult medical examination with abnormal findings: Secondary | ICD-10-CM

## 2017-01-28 MED ORDER — CEPHALEXIN 500 MG PO CAPS: 500.0000 mg | ORAL_CAPSULE | Freq: Three times a day (TID) | ORAL | 0 refills | Status: DC

## 2017-01-28 NOTE — Progress Notes (Signed)
Patient ID: Julie Hester, female   DOB: [birth year], 21 y.o.   MRN: [id number]   Subjective:    Patient ID: Julie Hester, female    DOB: [birth year], 21 y.o.   MRN: [id number]  HPI  Patient here for her physical exam.  She is doing better.  Was feeling increased anxiety, but states this is better.  Sleeping better.  Is exercising.  States rides 4-7 miles per day for 4-5 days per week and has been for the last several weeks.  Feels better doing this.  No chest pain.  No sob.  No acid reflux.  No abdominal pain.  Bowels moving.  Never took to buspar.  Discussed with her today.  She feels she is doing better.     Past Medical History:  Diagnosis Date  . Asthma    As a child  . Attention deficit disorder    History reviewed. No pertinent surgical history. Family History  Problem Relation Age of Onset  . Arthritis Mother   . Diabetes Father   . Cancer Maternal Grandmother        lung  . Cancer Maternal Grandfather        lung  . Cancer Paternal Grandmother        lung  . Cancer Paternal Grandfather        lung   Social History   Social History  . Marital status: Single    Spouse name: N/A  . Number of children: N/A  . Years of education: N/A   Social History Main Topics  . Smoking status: Former Smoker    Types: Cigarettes  . Smokeless tobacco: Never Used  . Alcohol use No  . Drug use: No  . Sexual activity: No   Other Topics Concern  . None   Social History Narrative  . None    Outpatient Encounter Prescriptions as of 01/28/2017  Medication Sig  . ALPRAZolam (XANAX) 0.25 MG tablet Take 1 tablet (0.25 mg total) by mouth daily as needed for anxiety.  . [DISCONTINUED] busPIRone (BUSPAR) 5 MG tablet Take 1 tablet (5 mg total) by mouth at bedtime.  . cephALEXin (KEFLEX) 500 MG capsule Take 1 capsule (500 mg total) by mouth 3 (three) times daily.   No facility-administered encounter medications on file as of 01/28/2017.     Review of Systems    Constitutional: Negative for appetite change and unexpected weight change.  HENT: Negative for congestion and sinus pressure.   Eyes: Negative for pain and visual disturbance.  Respiratory: Negative for cough, chest tightness and shortness of breath.   Cardiovascular: Negative for chest pain and palpitations.  Gastrointestinal: Negative for abdominal pain, diarrhea, nausea and vomiting.  Genitourinary: Negative for difficulty urinating and dysuria.  Musculoskeletal: Negative for back pain and joint swelling.  Skin: Negative for color change and rash.  Neurological: Negative for dizziness, light-headedness and headaches.  Hematological: Negative for adenopathy. Does not bruise/bleed easily.  Psychiatric/Behavioral: Negative for agitation and dysphoric mood.       Anxiety better.         Objective:    Physical Exam  Constitutional: She is oriented to person, place, and time. She appears well-developed and well-nourished. No distress.  HENT:  Nose: Nose normal.  Mouth/Throat: Oropharynx is clear and moist.  Eyes: Right eye exhibits no discharge. Left eye exhibits no discharge. No scleral icterus.  Neck: Neck supple. No thyromegaly present.  Cardiovascular: Normal rate and regular rhythm.   Pulmonary/Chest: Breath sounds normal.  No accessory muscle usage. No tachypnea. No respiratory distress. She has no decreased breath sounds. She has no wheezes. She has no rhonchi. Right breast exhibits no inverted nipple, no mass, no nipple discharge and no tenderness (no axillary adenopathy). Left breast exhibits no inverted nipple, no mass, no nipple discharge and no tenderness (no axilarry adenopathy).  Abdominal: Soft. Bowel sounds are normal. There is no tenderness.  Musculoskeletal: She exhibits no edema or tenderness.  Lymphadenopathy:    She has no cervical adenopathy.  Neurological: She is alert and oriented to person, place, and time.  Skin: Skin is warm. No rash noted. No erythema.   Axillary lesions/cysts - right open - no evidence of infection.  Left with fullness and tenderness.  Some surrounding erythema.    Psychiatric: She has a normal mood and affect. Her behavior is normal.    BP 126/84 (BP Location: Left Arm, Patient Position: Sitting, Cuff Size: Normal)   Pulse 90   Temp 98.6 F (37 C) (Oral)   Resp 14   Ht 5\' 4"  (1.626 m)   Wt 215 lb (97.5 kg)   LMP 01/26/2017   SpO2 98%   BMI 36.90 kg/m  Wt Readings from Last 3 Encounters:  01/28/17 215 lb (97.5 kg)  07/22/16 211 lb (95.7 kg)  03/22/16 198 lb 12.8 oz (90.2 kg)     Lab Results  Component Value Date   WBC 10.8 (H) 08/25/2016   HGB 12.2 08/25/2016   HCT 37.3 08/25/2016   PLT 352.0 08/25/2016   GLUCOSE 87 07/22/2016   CHOL 149 09/17/2014   TRIG 78.0 09/17/2014   HDL 48.90 09/17/2014   LDLCALC 85 09/17/2014   ALT 19 07/22/2016   AST 19 07/22/2016   NA 137 07/22/2016   K 4.1 07/22/2016   CL 103 07/22/2016   CREATININE 0.62 07/22/2016   BUN 9 07/22/2016   CO2 26 07/22/2016   TSH 3.05 07/22/2016   HGBA1C  07/09/2008    5.0 (NOTE)   The ADA recommends the following therapeutic goal for glycemic   control related to Hgb A1C measurement:   Goal of Therapy:   < 7.0% Hgb A1C   Reference: American Diabetes Association: Clinical Practice   Recommendations 2008, Diabetes Care,  2008, 31:(Suppl 1).       Assessment & Plan:   Problem List Items Addressed This Visit    Anxiety    Has improved.  Discussed with her today.  She did not take buspar.  Desires not to take.  Follow.  Desires no further intervention.        Attention deficit hyperactivity disorder    Is home schooled.  Stable.        Health care maintenance    Physical today 01/28/17.  She declines pap smear.        Hydradenitis    Axillary lesions as outlined.  Concern over increased erythema and tenderness - left.  Warm compresses.  Keflex.  Call with update over the next few days.  Discussed surgery referral. She wants to  hold at this time.  Follow.        Iron deficiency anemia    Follow [facility].            Dale DurhamSCOTT, Kanyla Omeara, MD

## 2017-01-29 ENCOUNTER — Encounter: Payer: Self-pay | Admitting: Internal Medicine

## 2017-01-30 ENCOUNTER — Encounter: Payer: Self-pay | Admitting: Internal Medicine

## 2017-01-30 DIAGNOSIS — L732 Hidradenitis suppurativa: Secondary | ICD-10-CM | POA: Insufficient documentation

## 2017-01-30 NOTE — Addendum Note (Signed)
Addended by: Charm BargesSCOTT, Bryon Parker S on: 01/30/2017 06:14 PM   Modules accepted: Level of Service

## 2017-01-30 NOTE — Assessment & Plan Note (Signed)
Follow [facility].  

## 2017-01-30 NOTE — Assessment & Plan Note (Signed)
Axillary lesions as outlined.  Concern over increased erythema and tenderness - left.  Warm compresses.  Keflex.  Call with update over the next few days.  Discussed surgery referral. She wants to hold at this time.  Follow.

## 2017-01-30 NOTE — Assessment & Plan Note (Signed)
Has improved.  Discussed with her today.  She did not take buspar.  Desires not to take.  Follow.  Desires no further intervention.

## 2017-01-30 NOTE — Assessment & Plan Note (Signed)
Is home schooled.  Stable.

## 2017-01-30 NOTE — Assessment & Plan Note (Signed)
Physical today 01/28/17.  She declines pap smear.

## 2017-06-13 ENCOUNTER — Encounter: Payer: Self-pay | Admitting: Internal Medicine

## 2017-06-13 NOTE — Telephone Encounter (Signed)
I would really like to see her prior to prescribing medication.  Would like for her to schedule appt.

## 2017-06-21 ENCOUNTER — Telehealth: Payer: Self-pay | Admitting: Internal Medicine

## 2017-06-21 NOTE — Telephone Encounter (Signed)
Medication is not on patients list please advise.

## 2017-06-21 NOTE — Telephone Encounter (Signed)
Copied from CRM [id number]. Topic: Quick Communication - Rx Refill/Question >> Jun 21, 2017  2:07 PM Landry MellowFoltz, Melissa J wrote: Medication: zoloft   Has the patient contacted their pharmacy? No.   (Agent: If no, request that the patient contact the pharmacy for the refill.)   Preferred Pharmacy (with phone number or street name): [facility] [address]  Pt says she was on this before and wants to go back on it.  She does not want to wait until her April appt if she can get it sooner.     Agent: Please be advised that RX refills may take up to 3 business days. We ask that you follow-up with your pharmacy.

## 2017-06-23 NOTE — Telephone Encounter (Signed)
Patient returning [place] phone call. Please contact pt.

## 2017-06-23 NOTE — Telephone Encounter (Signed)
Please advise 

## 2017-06-23 NOTE — Telephone Encounter (Signed)
Patient stated she was ok to wait until April 25 at her scheduled appt to discuss starting zoloft

## 2017-06-23 NOTE — Telephone Encounter (Signed)
Left message to call back  

## 2017-07-28 ENCOUNTER — Encounter

## 2017-07-28 ENCOUNTER — Encounter: Payer: Self-pay | Admitting: Internal Medicine

## 2017-07-28 ENCOUNTER — Ambulatory Visit (INDEPENDENT_AMBULATORY_CARE_PROVIDER_SITE_OTHER): Payer: BLUE CROSS/BLUE SHIELD | Admitting: Internal Medicine

## 2017-07-28 DIAGNOSIS — D509 Iron deficiency anemia, unspecified: Secondary | ICD-10-CM | POA: Diagnosis not present

## 2017-07-28 DIAGNOSIS — F419 Anxiety disorder, unspecified: Secondary | ICD-10-CM | POA: Diagnosis not present

## 2017-07-28 DIAGNOSIS — F909 Attention-deficit hyperactivity disorder, unspecified type: Secondary | ICD-10-CM

## 2017-07-28 DIAGNOSIS — Z6836 Body mass index (BMI) 36.0-36.9, adult: Secondary | ICD-10-CM | POA: Diagnosis not present

## 2017-07-28 MED ORDER — SERTRALINE HCL 25 MG PO TABS: 25.0000 mg | ORAL_TABLET | Freq: Every day | ORAL | 1 refills | Status: DC

## 2017-07-28 NOTE — Progress Notes (Signed)
Patient ID: Julie Hester, female   DOB: 1995-05-02, 22 y.o.   MRN: [id number]   Subjective:    Patient ID: Julie Hester, female    DOB: 1996-02-16, 21 y.o.   MRN: [id number]  HPI  Patient here for a scheduled follow up.  She reports she is feeling better.  Doing better.  Still with increased anxiety, but feels is some better.  Does feel she needs to be on something to help level things out.  Wants to graduate.  Has started exercising.  Is biking.  Also walking.  No chest pain.  No sob.  No acid reflux.  No abdominal pain.  Bowels moving.  No urine change.  States she has been talking to a boy over the internet.  They have been communicating for a while.  Has not met him face to face.  Enjoying the communication.  Overall feels better.  Would like to start zoloft.      Past Medical History:  Diagnosis Date  . Asthma    As a child  . Attention deficit disorder    History reviewed. No pertinent surgical history. Family History  Problem Relation Age of Onset  . Arthritis Mother   . Diabetes Father   . Cancer Maternal Grandmother        lung  . Cancer Maternal Grandfather        lung  . Cancer Paternal Grandmother        lung  . Cancer Paternal Grandfather        lung   Social History   Socioeconomic History  . Marital status: Single    Spouse name: Not on file  . Number of children: Not on file  . Years of education: Not on file  . Highest education level: Not on file  Occupational History  . Not on file  Social Needs  . Financial resource strain: Not on file  . Food insecurity:    Worry: Not on file    Inability: Not on file  . Transportation needs:    Medical: Not on file    Non-medical: Not on file  Tobacco Use  . Smoking status: Former Smoker    Types: Cigarettes  . Smokeless tobacco: Never Used  Substance and Sexual Activity  . Alcohol use: No    Alcohol/week: 0.0 oz  . Drug use: No  . Sexual activity: Never  Lifestyle  . Physical activity:   Days per week: Not on file    Minutes per session: Not on file  . Stress: Not on file  Relationships  . Social connections:    Talks on phone: Not on file    Gets together: Not on file    Attends religious service: Not on file    Active member of club or organization: Not on file    Attends meetings of clubs or organizations: Not on file    Relationship status: Not on file  Other Topics Concern  . Not on file  Social History Narrative  . Not on file    Outpatient Encounter Medications as of 07/28/2017  Medication Sig  . sertraline (ZOLOFT) 25 MG tablet Take 1 tablet (25 mg total) by mouth daily.  . [DISCONTINUED] ALPRAZolam (XANAX) 0.25 MG tablet Take 1 tablet (0.25 mg total) by mouth daily as needed for anxiety.  . [DISCONTINUED] cephALEXin (KEFLEX) 500 MG capsule Take 1 capsule (500 mg total) by mouth 3 (three) times daily.   No facility-administered encounter medications on file as  of 07/28/2017.     Review of Systems  Constitutional: Negative for appetite change and unexpected weight change.  HENT: Negative for congestion and sinus pressure.   Respiratory: Negative for cough, chest tightness and shortness of breath.   Cardiovascular: Negative for chest pain, palpitations and leg swelling.  Gastrointestinal: Negative for abdominal pain, diarrhea, nausea and vomiting.  Genitourinary: Negative for difficulty urinating and dysuria.  Musculoskeletal: Negative for joint swelling and myalgias.  Skin: Negative for color change and rash.  Neurological: Negative for dizziness, light-headedness and headaches.  Psychiatric/Behavioral: Positive for sleep disturbance.       Increased anxiety.  Is some better.  No suicidal thoughts.  No increased depression.        Objective:     Blood pressure rechecked by me:  114/72  Physical Exam  Constitutional: She appears well-developed and well-nourished. No distress.  HENT:  Nose: Nose normal.  Mouth/Throat: Oropharynx is clear and moist.   Neck: Neck supple. No thyromegaly present.  Cardiovascular: Normal rate and regular rhythm.  Pulmonary/Chest: Breath sounds normal. No respiratory distress. She has no wheezes.  Abdominal: Soft. Bowel sounds are normal. There is no tenderness.  Musculoskeletal: She exhibits no edema or tenderness.  Lymphadenopathy:    She has no cervical adenopathy.  Skin: No rash noted. No erythema.  Psychiatric: She has a normal mood and affect. Her behavior is normal.    BP 114/72   Pulse 75   Temp 98.2 F (36.8 C) (Oral)   Resp 18   Wt 215 lb (97.5 kg)   SpO2 98%   BMI 36.90 kg/m  Wt Readings from Last 3 Encounters:  07/28/17 215 lb (97.5 kg)  01/28/17 215 lb (97.5 kg)  07/22/16 211 lb (95.7 kg)     Lab Results  Component Value Date   WBC 10.8 (H) 08/25/2016   HGB 12.2 08/25/2016   HCT 37.3 08/25/2016   PLT 352.0 08/25/2016   GLUCOSE 87 07/22/2016   CHOL 149 09/17/2014   TRIG 78.0 09/17/2014   HDL 48.90 09/17/2014   LDLCALC 85 09/17/2014   ALT 19 07/22/2016   AST 19 07/22/2016   NA 137 07/22/2016   K 4.1 07/22/2016   CL 103 07/22/2016   CREATININE 0.62 07/22/2016   BUN 9 07/22/2016   CO2 26 07/22/2016   TSH 3.05 07/22/2016   HGBA1C  07/09/2008    5.0 (NOTE)   The ADA recommends the following therapeutic goal for glycemic   control related to Hgb A1C measurement:   Goal of Therapy:   < 7.0% Hgb A1C   Reference: American Diabetes Association: Clinical Practice   Recommendations 2008, Diabetes Care,  2008, 31:(Suppl 1).       Assessment & Plan:   Problem List Items Addressed This Visit    Anxiety    Has improved.  Discussed with her today.  Request to start zoloft.  Will start 32m q day.  Follow closely.  Get her back in soon to reassess.        Relevant Medications   sertraline (ZOLOFT) 25 MG tablet   Attention deficit hyperactivity disorder    She feels controlling some of her anxiety will help with this.  Has been home schooled.  Follow.        BMI  36.0-36.9,adult    She has started exercising.  Discussed diet and exercise.  Follow.        Iron deficiency anemia    Has a documented history of anemia.  Last  hgb wnl.  Follow.            Einar Pheasant, MD

## 2017-07-31 ENCOUNTER — Encounter: Payer: Self-pay | Admitting: Internal Medicine

## 2017-07-31 DIAGNOSIS — Z6838 Body mass index (BMI) 38.0-38.9, adult: Secondary | ICD-10-CM | POA: Insufficient documentation

## 2017-07-31 NOTE — Assessment & Plan Note (Signed)
She has started exercising.  Discussed diet and exercise.  Follow.

## 2017-07-31 NOTE — Assessment & Plan Note (Signed)
Has a documented history of anemia.  Last hgb wnl.  Follow.

## 2017-07-31 NOTE — Assessment & Plan Note (Signed)
She feels controlling some of her anxiety will help with this.  Has been home schooled.  Follow.

## 2017-07-31 NOTE — Assessment & Plan Note (Signed)
Has improved.  Discussed with her today.  Request to start zoloft.  Will start  q day.  Follow closely.  Get her back in soon to reassess.

## 2017-09-15 ENCOUNTER — Ambulatory Visit (INDEPENDENT_AMBULATORY_CARE_PROVIDER_SITE_OTHER): Payer: BLUE CROSS/BLUE SHIELD | Admitting: Internal Medicine

## 2017-09-15 ENCOUNTER — Encounter: Payer: Self-pay | Admitting: Internal Medicine

## 2017-09-15 DIAGNOSIS — Z6837 Body mass index (BMI) 37.0-37.9, adult: Secondary | ICD-10-CM

## 2017-09-15 DIAGNOSIS — F419 Anxiety disorder, unspecified: Secondary | ICD-10-CM

## 2017-09-15 DIAGNOSIS — F909 Attention-deficit hyperactivity disorder, unspecified type: Secondary | ICD-10-CM | POA: Diagnosis not present

## 2017-09-15 DIAGNOSIS — D509 Iron deficiency anemia, unspecified: Secondary | ICD-10-CM | POA: Diagnosis not present
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
## 2017-09-18 ENCOUNTER — Encounter: Payer: Self-pay | Admitting: Internal Medicine

## 2017-09-18 NOTE — Assessment & Plan Note (Signed)
Follow [facility].  

## 2017-09-18 NOTE — Assessment & Plan Note (Signed)
Discussed at length with her today.  Some increased nightmares and has had some hallucinations recently.  Discussed need for psychiatry evaluation.  She is in agreement.  No suicidal ideations.

## 2017-09-18 NOTE — Assessment & Plan Note (Signed)
She is completing some classes now.  Plans to go to college.  Follow.

## 2017-09-18 NOTE — Assessment & Plan Note (Signed)
Discussed regular exercise.  Discussed diet adjustment.  Follow.

## 2018-01-31 ENCOUNTER — Encounter: Payer: BLUE CROSS/BLUE SHIELD | Admitting: Internal Medicine

## 2018-02-07 ENCOUNTER — Encounter: Payer: BLUE CROSS/BLUE SHIELD | Admitting: Internal Medicine

## 2018-02-07 ENCOUNTER — Encounter: Payer: Self-pay | Admitting: Internal Medicine

## 2018-02-07 ENCOUNTER — Ambulatory Visit (INDEPENDENT_AMBULATORY_CARE_PROVIDER_SITE_OTHER): Payer: BLUE CROSS/BLUE SHIELD | Admitting: Internal Medicine

## 2018-02-07 DIAGNOSIS — Z6838 Body mass index (BMI) 38.0-38.9, adult: Secondary | ICD-10-CM | POA: Diagnosis not present

## 2018-02-07 DIAGNOSIS — F419 Anxiety disorder, unspecified: Secondary | ICD-10-CM

## 2018-02-07 DIAGNOSIS — Z23 Encounter for immunization: Secondary | ICD-10-CM

## 2018-02-07 DIAGNOSIS — D509 Iron deficiency anemia, unspecified: Secondary | ICD-10-CM

## 2018-02-07 NOTE — Progress Notes (Signed)
Patient ID: Julie Hester, female   DOB: 1996/04/02, 22 y.o.   MRN: [id number]   Subjective:    Patient ID: Julie Hester, female    DOB: 1995-11-12, 22 y.o.   MRN: [id number]  HPI  Patient here for her physical exam.  She is with her boyfriend today.  Desires not to have a physical.  States she is doing better.  Since she has been with her boyfriend, she is going out more.  Not as anxious.  States he is helping her.  Is more active.  Still enjoys cooking.  No increased cough or congestion.  No acid reflux.  No abdominal pain.  Bowels moving.  Discussed counseling.  Discussed psychiatry referral.  She wants to hold at this time.     Past Medical History:  Diagnosis Date  . Asthma    As a child  . Attention deficit disorder    History reviewed. No pertinent surgical history. Family History  Problem Relation Age of Onset  . Arthritis Mother   . Diabetes Father   . Cancer Maternal Grandmother        lung  . Cancer Maternal Grandfather        lung  . Cancer Paternal Grandmother        lung  . Cancer Paternal Grandfather        lung   Social History   Socioeconomic History  . Marital status: Single    Spouse name: Not on file  . Number of children: Not on file  . Years of education: Not on file  . Highest education level: Not on file  Occupational History  . Not on file  Social Needs  . Financial resource strain: Not on file  . Food insecurity:    Worry: Not on file    Inability: Not on file  . Transportation needs:    Medical: Not on file    Non-medical: Not on file  Tobacco Use  . Smoking status: Former Smoker    Types: Cigarettes  . Smokeless tobacco: Never Used  Substance and Sexual Activity  . Alcohol use: No    Alcohol/week: 0.0 standard drinks  . Drug use: No  . Sexual activity: Never  Lifestyle  . Physical activity:    Days per week: Not on file    Minutes per session: Not on file  . Stress: Not on file  Relationships  . Social connections:    Talks on phone: Not on file    Gets together: Not on file    Attends religious service: Not on file    Active member of club or organization: Not on file    Attends meetings of clubs or organizations: Not on file    Relationship status: Not on file  Other Topics Concern  . Not on file  Social History Narrative  . Not on file    No outpatient encounter medications on file as of 02/07/2018.   No facility-administered encounter medications on file as of 02/07/2018.     Review of Systems  Constitutional: Negative for appetite change and unexpected weight change.  HENT: Negative for congestion and sinus pressure.   Eyes: Negative for pain and visual disturbance.  Respiratory: Negative for cough, chest tightness and shortness of breath.   Cardiovascular: Negative for chest pain, palpitations and leg swelling.  Gastrointestinal: Negative for abdominal pain, diarrhea, nausea and vomiting.  Genitourinary: Negative for difficulty urinating and dysuria.  Musculoskeletal: Negative for joint swelling and myalgias.  Skin:  Negative for color change and rash.  Neurological: Negative for dizziness, light-headedness and headaches.  Hematological: Negative for adenopathy. Does not bruise/bleed easily.  Psychiatric/Behavioral: Negative for agitation and dysphoric mood.       Objective:    Physical Exam  Constitutional: She appears well-developed and well-nourished. No distress.  HENT:  Nose: Nose normal.  Mouth/Throat: Oropharynx is clear and moist.  Neck: Neck supple. No thyromegaly present.  Cardiovascular: Normal rate and regular rhythm.  Pulmonary/Chest: Breath sounds normal. No respiratory distress. She has no wheezes.  Abdominal: Soft. Bowel sounds are normal. There is no tenderness.  Musculoskeletal: She exhibits no edema or tenderness.  Lymphadenopathy:    She has no cervical adenopathy.  Skin: No rash noted. No erythema.  Psychiatric: She has a normal mood and affect. Her behavior  is normal.    BP 130/80 (BP Location: Left Arm, Patient Position: Sitting, Cuff Size: Large)   Pulse 96   Temp 98.1 F (36.7 C) (Oral)   Resp 18   Wt 226 lb (102.5 kg)   SpO2 96%   BMI 38.79 kg/m  Wt Readings from Last 3 Encounters:  02/07/18 226 lb (102.5 kg)  09/15/17 216 lb 12.8 oz (98.3 kg)  07/28/17 215 lb (97.5 kg)     Lab Results  Component Value Date   WBC 10.8 (H) 08/25/2016   HGB 12.2 08/25/2016   HCT 37.3 08/25/2016   PLT 352.0 08/25/2016   GLUCOSE 87 07/22/2016   CHOL 149 09/17/2014   TRIG 78.0 09/17/2014   HDL 48.90 09/17/2014   LDLCALC 85 09/17/2014   ALT 19 07/22/2016   AST 19 07/22/2016   NA 137 07/22/2016   K 4.1 07/22/2016   CL 103 07/22/2016   CREATININE 0.62 07/22/2016   BUN 9 07/22/2016   CO2 26 07/22/2016   TSH 3.05 07/22/2016   HGBA1C  07/09/2008    5.0 (NOTE)   The ADA recommends the following therapeutic goal for glycemic   control related to Hgb A1C measurement:   Goal of Therapy:   < 7.0% Hgb A1C   Reference: American Diabetes Association: Clinical Practice   Recommendations 2008, Diabetes Care,  2008, 31:(Suppl 1).       Assessment & Plan:   Problem List Items Addressed This Visit    Anxiety    Discussed with her today.  Discussed counseling.  Discussed psychiatry referral.  She feels she is doing better.  Wants to hold on referral.  Follow.        BMI 38.0-38.9,adult    Discussed diet and exercise.  Follow.        Iron deficiency anemia    Follow cbc.         Other Visit Diagnoses    Need for immunization against influenza       Relevant Orders   Flu Vaccine QUAD 36+ mos IM (Completed)       Dale Oneida, MD

## 2018-02-12 ENCOUNTER — Encounter: Payer: Self-pay | Admitting: Internal Medicine

## 2018-02-12 NOTE — Assessment & Plan Note (Signed)
Follow [facility].  

## 2018-02-12 NOTE — Assessment & Plan Note (Signed)
Discussed diet and exercise.  Follow.  

## 2018-02-12 NOTE — Assessment & Plan Note (Signed)
Discussed with her today.  Discussed counseling.  Discussed psychiatry referral.  She feels she is doing better.  Wants to hold on referral.  Follow.

## 2018-06-27 ENCOUNTER — Ambulatory Visit: Payer: BLUE CROSS/BLUE SHIELD | Admitting: Internal Medicine

## 2018-11-02 ENCOUNTER — Other Ambulatory Visit: Payer: Self-pay

## 2018-11-06 ENCOUNTER — Ambulatory Visit: Payer: BLUE CROSS/BLUE SHIELD | Admitting: Internal Medicine

## 2018-12-25 ENCOUNTER — Encounter: Payer: Self-pay | Admitting: Internal Medicine

## 2018-12-25 ENCOUNTER — Ambulatory Visit (INDEPENDENT_AMBULATORY_CARE_PROVIDER_SITE_OTHER): Payer: BLUE CROSS/BLUE SHIELD | Admitting: Internal Medicine

## 2018-12-25 ENCOUNTER — Other Ambulatory Visit: Payer: Self-pay

## 2018-12-25 DIAGNOSIS — F419 Anxiety disorder, unspecified: Secondary | ICD-10-CM | POA: Diagnosis not present

## 2018-12-25 DIAGNOSIS — D509 Iron deficiency anemia, unspecified: Secondary | ICD-10-CM | POA: Diagnosis not present

## 2018-12-25 DIAGNOSIS — F909 Attention-deficit hyperactivity disorder, unspecified type: Secondary | ICD-10-CM

## 2018-12-25 NOTE — Telephone Encounter (Signed)
Patient was seen for a virtual visit today.

## 2018-12-25 NOTE — Progress Notes (Signed)
Patient has lost 40 lbs since last visit.

## 2018-12-25 NOTE — Progress Notes (Signed)
Patient ID: Julie Hester, female   DOB: 24-May-1995, 23 y.o.   MRN: [id number]   Virtual Visit via video Note  This visit type was conducted due to national recommendations for restrictions regarding the COVID-19 pandemic (e.g. social distancing).  This format is felt to be most appropriate for this patient at this time.  All issues noted in this document were discussed and addressed.  No physical exam was performed (except for noted visual exam findings with Video Visits).   I connected with Julie Hester by a video enabled telemedicine application and verified that I am speaking with the correct person using two identifiers. Location patient: home Location provider: work Persons participating in the virtual visit: patient, provider  I discussed the limitations, risks, security and privacy concerns of performing an evaluation and management service by video and the availability of in person appointments.  The patient expressed understanding and agreed to proceed.   Reason for visit: scheduled follow up.   HPI: She reports she is doing relatively well.  Has adjusted her diet.  Has lost weight.  Engaged.  Going out more.  Some anxiety.  Discussed with her, but overall she feels she is doing better.  Sleeping better.  Discussed diet and exercise.  No chest pain.  No sob.  No acid reflux.  No abdominal pain.  Bowels moving.     ROS: See pertinent positives and negatives per HPI.  Past Medical History:  Diagnosis Date  . Asthma    As a child  . Attention deficit disorder     History reviewed. No pertinent surgical history.  Family History  Problem Relation Age of Onset  . Arthritis Mother   . Diabetes Father   . Cancer Maternal Grandmother        lung  . Cancer Maternal Grandfather        lung  . Cancer Paternal Grandmother        lung  . Cancer Paternal Grandfather        lung    SOCIAL HX: reviewed.   No current outpatient medications on file.  EXAM:  GENERAL:  alert, oriented, appears well and in no acute distress  HEENT: atraumatic, conjunttiva clear, no obvious abnormalities on inspection of external nose and ears  NECK: normal movements of the head and neck  LUNGS: on inspection no signs of respiratory distress, breathing rate appears normal, no obvious gross SOB, gasping or wheezing  CV: no obvious cyanosis  PSYCH/NEURO: pleasant and cooperative, no obvious depression or anxiety, speech and thought processing grossly intact  ASSESSMENT AND PLAN:  Discussed the following assessment and plan:  No problem-specific Assessment & Plan notes found for this encounter.    I discussed the assessment and treatment plan with the patient. The patient was provided an opportunity to ask questions and all were answered. The patient agreed with the plan and demonstrated an understanding of the instructions.   The patient was advised to call back or seek an in-person evaluation if the symptoms worsen or if the condition fails to improve as anticipated.   Einar Pheasant, MD

## 2018-12-30 ENCOUNTER — Encounter: Payer: Self-pay | Admitting: Internal Medicine

## 2018-12-30 NOTE — Assessment & Plan Note (Signed)
Planning to graduate soon.  Follow.  Doing well.

## 2018-12-30 NOTE — Assessment & Plan Note (Signed)
Overall she is doing better.  Sleeping better.  Feels better.  Follow.

## 2018-12-30 NOTE — Assessment & Plan Note (Signed)
Follow [facility].  

## 2019-05-15 ENCOUNTER — Encounter: Payer: BLUE CROSS/BLUE SHIELD | Admitting: Internal Medicine

## 2019-07-10 ENCOUNTER — Encounter: Payer: BLUE CROSS/BLUE SHIELD | Admitting: Internal Medicine

## 2019-07-23 ENCOUNTER — Encounter: Payer: BLUE CROSS/BLUE SHIELD | Admitting: Internal Medicine

## 2019-09-07 ENCOUNTER — Telehealth: Payer: Self-pay | Admitting: Internal Medicine

## 2019-09-07 NOTE — Telephone Encounter (Signed)
Pt wanted to know if she could have a note excusing her from jury duty due to PTSD. Pt did not know the date right off hand. Please advise

## 2019-09-10 NOTE — Telephone Encounter (Signed)
Ok for jury duty letter - to state please excuse from [employer] due to underlying medical issues.

## 2019-09-10 NOTE — Telephone Encounter (Signed)
Letter printed & signed.

## 2019-09-10 NOTE — Telephone Encounter (Signed)
Are you ok to write a letter for her excusing her from jury duty? We do not need the dates of her jury duty

## 2019-09-11 NOTE — Telephone Encounter (Signed)
Left message for pt. Need to know if she would like letter picked up or mailed to her.

## 2019-11-15 ENCOUNTER — Encounter: Payer: BLUE CROSS/BLUE SHIELD | Admitting: Internal Medicine

## 2019-12-20 ENCOUNTER — Encounter: Payer: BLUE CROSS/BLUE SHIELD | Admitting: Internal Medicine

## 2020-04-03 ENCOUNTER — Encounter: Payer: BLUE CROSS/BLUE SHIELD | Admitting: Internal Medicine

## 2020-04-10 ENCOUNTER — Encounter: Payer: BLUE CROSS/BLUE SHIELD | Admitting: Internal Medicine

## 2020-04-16 ENCOUNTER — Telehealth: Payer: Self-pay | Admitting: Internal Medicine

## 2020-04-16 MED ORDER — MUPIROCIN 2 % EX OINT: TOPICAL_OINTMENT | CUTANEOUS | 0 refills | Status: DC

## 2020-04-16 NOTE — Telephone Encounter (Signed)
rx sent in for bactroban.  Mother had called in.  Flare with HS.  See her chart.  Follow.  Will need to call if persistent problems.

## 2020-05-07 ENCOUNTER — Encounter: Payer: Self-pay | Admitting: *Deleted

## 2020-05-14 ENCOUNTER — Telehealth: Payer: Self-pay

## 2020-05-14 MED ORDER — MUPIROCIN 2 % EX OINT: TOPICAL_OINTMENT | CUTANEOUS | 0 refills | Status: DC

## 2020-05-14 NOTE — Telephone Encounter (Signed)
Pts mother stated that the lesions were getting better when using bactroban. Lost tube from January. I have sent in refill for her.

## 2020-05-27 ENCOUNTER — Encounter: Payer: BLUE CROSS/BLUE SHIELD | Admitting: Internal Medicine

## 2020-07-31 ENCOUNTER — Other Ambulatory Visit: Payer: Self-pay

## 2020-08-04 ENCOUNTER — Ambulatory Visit (INDEPENDENT_AMBULATORY_CARE_PROVIDER_SITE_OTHER): Payer: BLUE CROSS/BLUE SHIELD | Admitting: Internal Medicine

## 2020-08-04 ENCOUNTER — Other Ambulatory Visit: Payer: Self-pay

## 2020-08-04 VITALS — BP 124/70 | HR 100 | Temp 98.0°F | Resp 16 | Ht 64.0 in | Wt 178.0 lb

## 2020-08-04 DIAGNOSIS — D509 Iron deficiency anemia, unspecified: Secondary | ICD-10-CM

## 2020-08-04 DIAGNOSIS — F513 Sleepwalking [somnambulism]: Secondary | ICD-10-CM

## 2020-08-04 DIAGNOSIS — L732 Hidradenitis suppurativa: Secondary | ICD-10-CM

## 2020-08-04 DIAGNOSIS — F419 Anxiety disorder, unspecified: Secondary | ICD-10-CM

## 2020-08-04 DIAGNOSIS — Z13220 Encounter for screening for lipoid disorders: Secondary | ICD-10-CM

## 2020-08-04 NOTE — Progress Notes (Signed)
Patient ID: Julie Hester, female   DOB: 04-Jul-1995, 25 y.o.   MRN: [id number]   Subjective:    Patient ID: Julie Hester, female    DOB: 09/22/1995, 25 y.o.   MRN: [id number]  HPI This visit occurred during the SARS-CoV-2 public health emergency.  Safety protocols were in place, including screening questions prior to the visit, additional usage of staff PPE, and extensive cleaning of exam room while observing appropriate contact time as indicated for disinfecting solutions.  Patient here for a scheduled physical.  Given current issues, appt changed to a recheck appt.  She is accompanied by her boyfriend.  History obtained from both of them.  She reports she is doing better.  Going out more.  Able to talk to other people.  She has been doing more writing.  Riding exercise bike.  Apparently taking some herbal supplements.  Having issues with hydradenitis.  Has been sleep walking.  Also reports vivid dreams.  No known triggers.  Actually feels overall better.    Past Medical History:  Diagnosis Date  . Asthma    As a child  . Attention deficit disorder    History reviewed. No pertinent surgical history. Family History  Problem Relation Age of Onset  . Arthritis Mother   . Diabetes Father   . Cancer Maternal Grandmother        lung  . Cancer Maternal Grandfather        lung  . Cancer Paternal Grandmother        lung  . Cancer Paternal Grandfather        lung   Social History   Socioeconomic History  . Marital status: Single    Spouse name: Not on file  . Number of children: Not on file  . Years of education: Not on file  . Highest education level: Not on file  Occupational History  . Not on file  Tobacco Use  . Smoking status: Former Smoker    Types: Cigarettes  . Smokeless tobacco: Never Used  Substance and Sexual Activity  . Alcohol use: No    Alcohol/week: 0.0 standard drinks  . Drug use: No  . Sexual activity: Never  Other Topics Concern  . Not on file   Social History Narrative  . Not on file   Social Determinants of Health   Financial Resource Strain: Not on file  Food Insecurity: Not on file  Transportation Needs: Not on file  Physical Activity: Not on file  Stress: Not on file  Social Connections: Not on file    Outpatient Encounter Medications as of 08/04/2020  Medication Sig  . mupirocin ointment (BACTROBAN) 2 % Apply to affected area bid   No facility-administered encounter medications on file as of 08/04/2020.    Review of Systems  Constitutional: Negative for appetite change and unexpected weight change.  HENT: Negative for congestion and sinus pressure.   Respiratory: Negative for cough, chest tightness and shortness of breath.   Cardiovascular: Negative for chest pain, palpitations and leg swelling.  Gastrointestinal: Negative for abdominal pain, diarrhea, nausea and vomiting.  Genitourinary: Negative for difficulty urinating and dysuria.  Musculoskeletal: Negative for joint swelling and myalgias.  Skin: Negative for color change and rash.       Hydradenitis.    Neurological: Negative for dizziness, light-headedness and headaches.  Psychiatric/Behavioral: Negative for agitation and dysphoric mood.       Objective:    Physical Exam Vitals reviewed.  Constitutional:  General: She is not in acute distress.    Appearance: Normal appearance.  HENT:     Head: Normocephalic and atraumatic.     Right Ear: External ear normal.     Left Ear: External ear normal.  Eyes:     General: No scleral icterus.       Right eye: No discharge.        Left eye: No discharge.     Conjunctiva/sclera: Conjunctivae normal.  Neck:     Thyroid: No thyromegaly.  Cardiovascular:     Rate and Rhythm: Normal rate and regular rhythm.  Pulmonary:     Effort: No respiratory distress.     Breath sounds: Normal breath sounds. No wheezing.  Abdominal:     General: Bowel sounds are normal.     Palpations: Abdomen is soft.      Tenderness: There is no abdominal tenderness.  Musculoskeletal:        General: No swelling or tenderness.     Cervical back: Neck supple. No tenderness.  Lymphadenopathy:     Cervical: No cervical adenopathy.  Skin:    Comments: Hydradenitis - axilla and groin.   Neurological:     Mental Status: She is alert.  Psychiatric:        Mood and Affect: Mood normal.        Behavior: Behavior normal.     BP 124/70   Pulse 100   Temp 98 F (36.7 C) (Oral)   Resp 16   Ht 5\' 4"  (1.626 m)   Wt 178 lb (80.7 kg)   SpO2 99%   BMI 30.55 kg/m  Wt Readings from Last 3 Encounters:  08/04/20 178 lb (80.7 kg)  12/25/18 178 lb (80.7 kg)  02/07/18 226 lb (102.5 kg)     Lab Results  Component Value Date   WBC 14.0 (H) 08/04/2020   HGB 13.8 08/04/2020   HCT 40.6 08/04/2020   PLT 432.0 (H) 08/04/2020   GLUCOSE 104 (H) 08/04/2020   CHOL 152 08/04/2020   TRIG 82.0 08/04/2020   HDL 39.30 08/04/2020   LDLCALC 97 08/04/2020   ALT 39 (H) 08/04/2020   AST 38 (H) 08/04/2020   NA 132 (L) 08/04/2020   K 4.4 08/04/2020   CL 97 08/04/2020   CREATININE 0.64 08/04/2020   BUN 6 08/04/2020   CO2 26 08/04/2020   TSH 3.91 08/04/2020   HGBA1C  07/09/2008    5.0 (NOTE)   The ADA recommends the following therapeutic goal for glycemic   control related to Hgb A1C measurement:   Goal of Therapy:   < 7.0% Hgb A1C   Reference: American Diabetes Association: Clinical Practice   Recommendations 2008, Diabetes Care,  2008, 31:(Suppl 1).       Assessment & Plan:   Problem List Items Addressed This Visit    Anxiety    Accompanied by her boyfriend today.  Is doing some better.  Still with issues of increased anxiety, vivid dreams, sleep walking and talking.  Discussed.  Discussed psychiatry referral.  Will let me now if agreeable.        Relevant Orders   Comprehensive metabolic panel (Completed)   TSH (Completed)   Hydradenitis - Primary    Continue bactroban.  After reviewing labs and skin  evaluation, also start keflex as directed.  Follow.       Iron deficiency anemia    Check cbc and iron.        Relevant Orders   CBC  with Differential/Platelet (Completed)   Ferritin (Completed)   Sleep walking    Sleep walking as outlined.  Discussed.  No known triggers.  Discussed psychiatry referral as outlined.  Will notify me if agreeable.        Other Visit Diagnoses    Screening cholesterol level       Relevant Orders   Lipid panel (Completed)       Dale [place], MD

## 2020-08-05 LAB — CBC WITH DIFFERENTIAL/PLATELET
Basophils Absolute: 0.1 10*3/uL (ref 0.0–0.1)
Basophils Relative: 0.9 % (ref 0.0–3.0)
Eosinophils Absolute: 0.1 10*3/uL (ref 0.0–0.7)
Eosinophils Relative: 0.7 % (ref 0.0–5.0)
HCT: 40.6 % (ref 36.0–46.0)
Hemoglobin: 13.8 g/dL (ref 12.0–15.0)
Lymphocytes Relative: 14 % (ref 12.0–46.0)
Lymphs Abs: 2 10*3/uL (ref 0.7–4.0)
MCHC: 33.9 g/dL (ref 30.0–36.0)
MCV: 106.5 fl — ABNORMAL HIGH (ref 78.0–100.0)
Monocytes Absolute: 1 10*3/uL (ref 0.1–1.0)
Monocytes Relative: 7.1 % (ref 3.0–12.0)
Neutro Abs: 10.8 10*3/uL — ABNORMAL HIGH (ref 1.4–7.7)
Neutrophils Relative %: 77.3 % — ABNORMAL HIGH (ref 43.0–77.0)
Platelets: 432 10*3/uL — ABNORMAL HIGH (ref 150.0–400.0)
RBC: 3.81 Mil/uL — ABNORMAL LOW (ref 3.87–5.11)
RDW: 15.9 % — ABNORMAL HIGH (ref 11.5–15.5)
WBC: 14 10*3/uL — ABNORMAL HIGH (ref 4.0–10.5)

## 2020-08-05 LAB — LIPID PANEL
Cholesterol: 152 mg/dL (ref 0–200)
HDL: 39.3 mg/dL (ref >39.00)
LDL Cholesterol: 97 mg/dL (ref 0–99)
NonHDL: 113.13
Total CHOL/HDL Ratio: 4
Triglycerides: 82 mg/dL (ref 0.0–149.0)
VLDL: 16.4 mg/dL (ref 0.0–40.0)

## 2020-08-05 LAB — COMPREHENSIVE METABOLIC PANEL
ALT: 39 U/L — ABNORMAL HIGH (ref 0–35)
AST: 38 U/L — ABNORMAL HIGH (ref 0–37)
Albumin: 3.8 g/dL (ref 3.5–5.2)
Alkaline Phosphatase: 51 U/L (ref 39–117)
BUN: 6 mg/dL (ref 6–23)
CO2: 26 mEq/L (ref 19–32)
Calcium: 9.4 mg/dL (ref 8.4–10.5)
Chloride: 97 mEq/L (ref 96–112)
Creatinine, Ser: 0.64 mg/dL (ref 0.40–1.20)
GFR: 121.81 mL/min (ref >60.00)
Glucose, Bld: 104 mg/dL — ABNORMAL HIGH (ref 70–99)
Potassium: 4.4 mEq/L (ref 3.5–5.1)
Sodium: 132 mEq/L — ABNORMAL LOW (ref 135–145)
Total Bilirubin: 0.6 mg/dL (ref 0.2–1.2)
Total Protein: 7.4 g/dL (ref 6.0–8.3)

## 2020-08-05 LAB — TSH: TSH: 3.91 u[IU]/mL (ref 0.35–4.50)

## 2020-08-05 LAB — FERRITIN: Ferritin: 67.8 ng/mL (ref 10.0–291.0)

## 2020-08-06 ENCOUNTER — Other Ambulatory Visit: Payer: Self-pay | Admitting: Internal Medicine

## 2020-08-06 ENCOUNTER — Telehealth: Payer: Self-pay

## 2020-08-06 DIAGNOSIS — L732 Hidradenitis suppurativa: Secondary | ICD-10-CM

## 2020-08-06 DIAGNOSIS — D72829 Elevated white blood cell count, unspecified: Secondary | ICD-10-CM

## 2020-08-06 DIAGNOSIS — D7589 Other specified diseases of blood and blood-forming organs: Secondary | ICD-10-CM

## 2020-08-06 MED ORDER — CEPHALEXIN 500 MG PO CAPS: 500.0000 mg | ORAL_CAPSULE | Freq: Three times a day (TID) | ORAL | 0 refills | Status: DC

## 2020-08-06 NOTE — Progress Notes (Signed)
Order placed for f/u labs.  

## 2020-08-06 NOTE — Telephone Encounter (Signed)
-----   Message from Dale Eaton, MD sent at 08/06/2020  4:51 AM EDT ----- Notify pt that her sodium level is slightly decreased.  Need to confirm she is eating regular meals and not drinking an excessive amount of free water.  Liver tests are slightly elevated.  May be related to fatty liver.  Also recommend stopping herbal supplements.  Cholesterol levels look good.  White blood cell count is elevated.  She has been using bactroban for her hydradenitis.  Given the oozing noted on exam, I would like to start her on abx.  Confirm nkda.  If no, then keflex 500mg  tid x 1 week.  Will need to take probiotic daily while on abx and for two weeks after completing abx.  Hgb, iron stores, thyroid test and kidney function tests are wnl.  Recheck cbc in 7-10 days.

## 2020-08-10 ENCOUNTER — Encounter: Payer: Self-pay | Admitting: Internal Medicine

## 2020-08-10 DIAGNOSIS — F513 Sleepwalking [somnambulism]: Secondary | ICD-10-CM | POA: Insufficient documentation

## 2020-08-10 NOTE — Assessment & Plan Note (Signed)
Accompanied by her boyfriend today.  Is doing some better.  Still with issues of increased anxiety, vivid dreams, sleep walking and talking.  Discussed.  Discussed psychiatry referral.  Will let me now if agreeable.

## 2020-08-10 NOTE — Assessment & Plan Note (Signed)
Sleep walking as outlined.  Discussed.  No known triggers.  Discussed psychiatry referral as outlined.  Will notify me if agreeable.

## 2020-08-10 NOTE — Assessment & Plan Note (Signed)
Check cbc and iron.   

## 2020-08-10 NOTE — Assessment & Plan Note (Signed)
Continue bactroban.  After reviewing labs and skin evaluation, also start keflex as directed.  Follow.

## 2020-08-21 ENCOUNTER — Other Ambulatory Visit (INDEPENDENT_AMBULATORY_CARE_PROVIDER_SITE_OTHER): Payer: BLUE CROSS/BLUE SHIELD

## 2020-08-21 ENCOUNTER — Other Ambulatory Visit: Payer: Self-pay

## 2020-08-21 DIAGNOSIS — D7589 Other specified diseases of blood and blood-forming organs: Secondary | ICD-10-CM | POA: Diagnosis not present

## 2020-08-21 DIAGNOSIS — D72829 Elevated white blood cell count, unspecified: Secondary | ICD-10-CM

## 2020-08-22 LAB — CBC WITH DIFFERENTIAL/PLATELET
Basophils Absolute: 0.1 10*3/uL (ref 0.0–0.1)
Basophils Relative: 0.6 % (ref 0.0–3.0)
Eosinophils Absolute: 0.1 10*3/uL (ref 0.0–0.7)
Eosinophils Relative: 0.6 % (ref 0.0–5.0)
HCT: 40.4 % (ref 36.0–46.0)
Hemoglobin: 13.8 g/dL (ref 12.0–15.0)
Lymphocytes Relative: 11.9 % — ABNORMAL LOW (ref 12.0–46.0)
Lymphs Abs: 1.6 10*3/uL (ref 0.7–4.0)
MCHC: 34 g/dL (ref 30.0–36.0)
MCV: 106.2 fl — ABNORMAL HIGH (ref 78.0–100.0)
Monocytes Absolute: 0.7 10*3/uL (ref 0.1–1.0)
Monocytes Relative: 5.1 % (ref 3.0–12.0)
Neutro Abs: 10.9 10*3/uL — ABNORMAL HIGH (ref 1.4–7.7)
Neutrophils Relative %: 81.8 % — ABNORMAL HIGH (ref 43.0–77.0)
Platelets: 479 10*3/uL — ABNORMAL HIGH (ref 150.0–400.0)
RBC: 3.81 Mil/uL — ABNORMAL LOW (ref 3.87–5.11)
RDW: 15.4 % (ref 11.5–15.5)
WBC: 13.3 10*3/uL — ABNORMAL HIGH (ref 4.0–10.5)

## 2020-08-22 LAB — VITAMIN B12: Vitamin B-12: 169 pg/mL — ABNORMAL LOW (ref 211–911)

## 2020-08-24 ENCOUNTER — Other Ambulatory Visit: Payer: Self-pay | Admitting: Internal Medicine

## 2020-08-24 ENCOUNTER — Encounter: Payer: Self-pay | Admitting: Internal Medicine

## 2020-08-24 DIAGNOSIS — R945 Abnormal results of liver function studies: Secondary | ICD-10-CM

## 2020-08-24 DIAGNOSIS — D72829 Elevated white blood cell count, unspecified: Secondary | ICD-10-CM

## 2020-08-24 DIAGNOSIS — E871 Hypo-osmolality and hyponatremia: Secondary | ICD-10-CM

## 2020-08-24 DIAGNOSIS — R7989 Other specified abnormal findings of blood chemistry: Secondary | ICD-10-CM

## 2020-08-24 NOTE — Progress Notes (Signed)
Order placed for f/u labs.  

## 2020-08-27 ENCOUNTER — Telehealth: Payer: Self-pay

## 2020-08-27 NOTE — Telephone Encounter (Signed)
Pt would like a call back today about her labs

## 2020-08-28 ENCOUNTER — Telehealth (INDEPENDENT_AMBULATORY_CARE_PROVIDER_SITE_OTHER): Payer: BLUE CROSS/BLUE SHIELD | Admitting: Internal Medicine

## 2020-08-28 DIAGNOSIS — F419 Anxiety disorder, unspecified: Secondary | ICD-10-CM | POA: Diagnosis not present

## 2020-08-28 DIAGNOSIS — L732 Hidradenitis suppurativa: Secondary | ICD-10-CM | POA: Diagnosis not present

## 2020-08-28 DIAGNOSIS — F513 Sleepwalking [somnambulism]: Secondary | ICD-10-CM

## 2020-08-28 MED ORDER — BUSPIRONE HCL 5 MG PO TABS: 5.0000 mg | ORAL_TABLET | Freq: Every day | ORAL | 1 refills | Status: DC

## 2020-08-28 NOTE — Telephone Encounter (Signed)
Pt has appt this PM

## 2020-08-28 NOTE — Progress Notes (Addendum)
Patient ID: Julie Hester, female   DOB: 1995/04/28, 25 y.o.   MRN: [id number]   Subjective:    Patient ID: Julie Hester, female    DOB: [birth year], 25 y.o.   MRN: [id number]  HPI This visit occurred during the SARS-CoV-2 public health emergency. This visit type was conducted due to national recommendations for restrictios regarding the covid-19 pandemic (e.g. social distancing).  This format is felt to be most appropriate for this patient at this time.  All issues noted in the document were discussed and addressed.  No physical exam was performed (exceopt for noted visual exam findings with the video visit).   I connected with Julie Hester by a video enabled telemedicine application and verified that I am speaking with the correct person:   Location patient: home Location provider:  Work Persons participating in the virtual visit:  Patient and provider.   Virtual visit:   Work in with increased anxiety.  Reports some panic attacks.  States she cries over anything.  Wants to stay inside and watch movies and work on her writing. She has been exercising.  States will ride 4.5-6 miles on her bike.  Is walking.  Eating ok.  Feels needs something to help level things out.  Does not want to take SSRI.  Discussed psychiatry referral.  She is agreeable.    Past Medical History:  Diagnosis Date  . Asthma    As a child  . Attention deficit disorder    History reviewed. No pertinent surgical history. Family History  Problem Relation Age of Onset  . Arthritis Mother   . Diabetes Father   . Cancer Maternal Grandmother        lung  . Cancer Maternal Grandfather        lung  . Cancer Paternal Grandmother        lung  . Cancer Paternal Grandfather        lung   Social History   Socioeconomic History  . Marital status: Single    Spouse name: Not on file  . Number of children: Not on file  . Years of education: Not on file  . Highest education level: Not on file  Occupational  History  . Not on file  Tobacco Use  . Smoking status: Former Smoker    Types: Cigarettes  . Smokeless tobacco: Never Used  Substance and Sexual Activity  . Alcohol use: No    Alcohol/week: 0.0 standard drinks  . Drug use: No  . Sexual activity: Never  Other Topics Concern  . Not on file  Social History Narrative  . Not on file   Social Determinants of Health   Financial Resource Strain: Not on file  Food Insecurity: Not on file  Transportation Needs: Not on file  Physical Activity: Not on file  Stress: Not on file  Social Connections: Not on file    Outpatient Encounter Medications as of 08/28/2020  Medication Sig  . busPIRone (BUSPAR) 5 MG tablet Take 1 tablet (5 mg total) by mouth daily.  . mupirocin ointment (BACTROBAN) 2 % Apply to affected area bid  . [DISCONTINUED] cephALEXin (KEFLEX) 500 MG capsule Take 1 capsule (500 mg total) by mouth 3 (three) times daily. (Patient not taking: Reported on 08/28/2020)   No facility-administered encounter medications on file as of 08/28/2020.    Review of Systems  Constitutional: Negative for appetite change and unexpected weight change.  HENT: Negative for congestion and sinus pressure.   Respiratory: Negative for  cough, chest tightness and shortness of breath.   Cardiovascular: Negative for chest pain, palpitations and leg swelling.  Gastrointestinal: Negative for abdominal pain, diarrhea, nausea and vomiting.  Genitourinary: Negative for difficulty urinating and dysuria.  Skin: Negative for color change and rash.  Neurological: Negative for dizziness, light-headedness and headaches.  Psychiatric/Behavioral:       Increased anxiety.  Increased stress.         Objective:    Physical Exam  GENERAL:  Alert.  Oriented.  Appeared to be in no acute distress.  HEENT:  Atraumatic, conjunctiva clear, no obvious abnormalities on inspection of external nose or ears.   NECK:  Normal movements of head and neck.   LUNGS:  On  inspection no signs of respiratory distress, breathing rate appears normal, no obvious gross SOB.   CV:  No obvious cyanosis  PSYCH/NEURO:  Pleasant and cooperative, no obvious depression, speech and thought processes grossly intact.    Ht 5\' 4"  (1.626 m)   Wt 199 lb (90.3 kg)   BMI 34.16 kg/m  Wt Readings from Last 3 Encounters:  08/28/20 199 lb (90.3 kg)  08/04/20 178 lb (80.7 kg)  12/25/18 178 lb (80.7 kg)     Lab Results  Component Value Date   WBC 13.3 (H) 08/21/2020   HGB 13.8 08/21/2020   HCT 40.4 08/21/2020   PLT 479.0 (H) 08/21/2020   GLUCOSE 104 (H) 08/04/2020   CHOL 152 08/04/2020   TRIG 82.0 08/04/2020   HDL 39.30 08/04/2020   LDLCALC 97 08/04/2020   ALT 39 (H) 08/04/2020   AST 38 (H) 08/04/2020   NA 132 (L) 08/04/2020   K 4.4 08/04/2020   CL 97 08/04/2020   CREATININE 0.64 08/04/2020   BUN 6 08/04/2020   CO2 26 08/04/2020   TSH 3.91 08/04/2020   HGBA1C  07/09/2008    5.0 (NOTE)   The ADA recommends the following therapeutic goal for glycemic   control related to Hgb A1C measurement:   Goal of Therapy:   < 7.0% Hgb A1C   Reference: American Diabetes Association: Clinical Practice   Recommendations 2008, Diabetes Care,  2008, 31:(Suppl 1).       Assessment & Plan:   Problem List Items Addressed This Visit    Anxiety    Increased anxiety as outlined.  Discussed.  Feels needs something to help level things out.  Desires not to take SSRI. Discussed buspar.  She is agreeable.  Discussed psychiatry referral.  Agreeable.  Follow.       Relevant Medications   busPIRone (BUSPAR) 5 MG tablet   Other Relevant Orders   Ambulatory referral to Psychiatry   Hydradenitis    Continue bactroban. Discussed dermatology referral.  Notify me if agreeable.       Sleep walking    Still sleep walking.  Boyfriend monitors.  Discussed psychiatry referral.            2009, MD

## 2020-09-01 ENCOUNTER — Encounter: Payer: Self-pay | Admitting: Internal Medicine

## 2020-09-01 NOTE — Assessment & Plan Note (Signed)
Increased anxiety as outlined.  Discussed.  Feels needs something to help level things out.  Desires not to take SSRI. Discussed buspar.  She is agreeable.  Discussed psychiatry referral.  Agreeable.  Follow.

## 2020-09-01 NOTE — Assessment & Plan Note (Signed)
Continue bactroban. Discussed dermatology referral.  Notify me if agreeable.

## 2020-09-01 NOTE — Assessment & Plan Note (Signed)
Still sleep walking.  Boyfriend monitors.  Discussed psychiatry referral.

## 2020-10-01 ENCOUNTER — Encounter: Payer: Self-pay | Admitting: Internal Medicine

## 2020-10-01 ENCOUNTER — Telehealth: Payer: Self-pay

## 2020-10-01 NOTE — Telephone Encounter (Signed)
Pt wants to talk with you about busPIRone (BUSPAR) 5 MG tablet. She states that it is keeping her up at night. She is taking it later in the day to help her relax before bed. Please advise

## 2020-10-03 NOTE — Telephone Encounter (Signed)
LMTCB

## 2020-10-03 NOTE — Telephone Encounter (Signed)
She needs to be reevaluated.  Also, I had place order for psych referral.  Does she have a scheduled appt.

## 2020-10-03 NOTE — Telephone Encounter (Signed)
Pt states since she's been taking her Buspirone it has been keeping her up at night and its making her organs feel as if someone is puling the, apart . Pt also states she's been having severe migraines as well.

## 2020-10-08 NOTE — Telephone Encounter (Signed)
Pt appt has been scheduled for reevaluation. Pt state she has an appt with psych set up for 01/08/21

## 2020-10-09 ENCOUNTER — Telehealth: Payer: Self-pay | Admitting: Internal Medicine

## 2020-10-09 ENCOUNTER — Telehealth: Payer: Self-pay

## 2020-10-09 NOTE — Telephone Encounter (Signed)
See other telephone note 7/7

## 2020-10-09 NOTE — Telephone Encounter (Signed)
Called pt to reschedule appt due to provider meeting at the time she was scheduled. Left message for pt to call back. Pt scheduled to see margaret Arnett 7/11 at 2:30 pm virtually.

## 2020-10-09 NOTE — Telephone Encounter (Signed)
There is a provider meeting 10/15/20 at 12:00.  Morrie Sheldon had moved pts to 1:00 so that I could go to the meeting.  It appears that Ronin was added at 12:00.  I have a meeting during this time, so appt will need to be changed.  Please confirm pt doing ok and ok to wait for appt.  If acute issues, will need to be seen.

## 2020-10-13 ENCOUNTER — Telehealth (INDEPENDENT_AMBULATORY_CARE_PROVIDER_SITE_OTHER): Payer: BLUE CROSS/BLUE SHIELD | Admitting: Family

## 2020-10-13 DIAGNOSIS — L732 Hidradenitis suppurativa: Secondary | ICD-10-CM | POA: Diagnosis not present

## 2020-10-13 DIAGNOSIS — F419 Anxiety disorder, unspecified: Secondary | ICD-10-CM | POA: Diagnosis not present

## 2020-10-13 DIAGNOSIS — F32A Depression, unspecified: Secondary | ICD-10-CM | POA: Diagnosis not present

## 2020-10-13 MED ORDER — HYDROXYZINE HCL 10 MG PO TABS: 10.0000 mg | ORAL_TABLET | Freq: Two times a day (BID) | ORAL | 1 refills | Status: DC | PRN

## 2020-10-13 NOTE — Assessment & Plan Note (Addendum)
Uncontrolled, chronic. No systemic features. She declines seeing dermatology and strongly advised her to reconsider this for optimal treatment, prevention of flares. Advised clindamycin topical. She declines clindamycin topical and prefers to use topical bactroban which has worked for her in the past. She didn't tolerate or complete kelfex 2 months ago. She will resume topical bactroban and if no improvement, will prescribe oral antibiotic, likely clindamycin.

## 2020-10-13 NOTE — Assessment & Plan Note (Addendum)
Uncontrolled. Will start with atarax 10mg  bid.  She declines SSRI, in particular zoloft 50mg . We had a long discussion as it relates to therapeutic benefit of SSRI as it relates to her symptoms. She will consider this in the future.  Declines counseling referral at this time. Counseled on patient having frank discussion with loved ones, mother and fiance, as it relates to storing gun safety and the importance of this as for her and other's safety. Close follow up and she has a scheduled follow up with Dr in August.

## 2020-10-13 NOTE — Progress Notes (Signed)
Virtual Visit via Video Note  I connected with@  on 10/15/20 at  2:30 PM EDT by a video enabled telemedicine application and verified that I am speaking with the correct person using two identifiers.  Location patient: home Location provider:work  Persons participating in the virtual visit: patient, provider  I discussed the limitations of evaluation and management by telemedicine and the availability of in person appointments. The patient expressed understanding and agreed to proceed.   HPI:  Complains of increased anxiety and trouble sleeping x 8 weeks/   Endorses depression has worsened. She is seeing Dr Maryruth Bun Oct of this year. Trouble falling asleep and then at times trouble getting up in the morning.  She goes to bed 3am and gets up for workday at 730 am. She goes back to sleep 930am and sleeps to 1:30.  She has fleeting thoughts of what it would be like if she wasn't here.  No suicide plan. She had had suicide attempt in which she took 13 tylenol at a time 7-8 years ago. She was hospitalized for 2 weeks approx 10 years ago for depression, anxiety.  No thoughts of hurting anyone else .   No exercise.   She works as a Clinical research associate, Financial trader. She lives for writing and for Anette Riedel her fiance whom she has been with 07/2017. He has been supportive of her health.   She has a fiance. Fiance and mother have guns.Guns are not stored in safe.   She has been on abilify in the past. No h/o bipolar.   Denies a period of having more energy than usual, didn't require sleep, spending more money than usual and got into trouble, a time when so hyper got into trouble, more irritated that you started arguments.  Denies that these acts caused trouble at work, financially, or with family or personal relationships.    She had abdominal pain on Bupsar, since resolved after stopping medication.  Complains of right axilla tenderness, which is the worst over the past couple of days, worsening. She has been  using Aloe and Peroxide with relief. She has not been using bactroban but it has been helpful in the past.   No fever, chills.  Draining yellow to green. No known h/o MRSA.    She has states she HS for 6 years. She has not seen dermatology.  H/o hyradenitis. Had been managed with bactroban. Given keflex 08/04/20 and she had diarrhea so she didn't complete course  Seen by Dr Lorin Picket 08/28/20 virtual visit. She declined SSRI. Psychiatry referral. Trial of buspar 5mg .    ROS: See pertinent positives and negatives per HPI.    EXAM:  VITALS per patient if applicable: There were no vitals taken for this visit. BP Readings from Last 3 Encounters:  08/04/20 124/70  02/07/18 130/80  09/15/17 122/86   Wt Readings from Last 3 Encounters:  08/28/20 199 lb (90.3 kg)  08/04/20 178 lb (80.7 kg)  12/25/18 178 lb (80.7 kg)    GENERAL: alert, oriented, appears well and in no acute distress  HEENT: atraumatic, conjunttiva clear, no obvious abnormalities on inspection of external nose and ears  NECK: normal movements of the head and neck  LUNGS: on inspection no signs of respiratory distress, breathing rate appears normal, no obvious gross SOB, gasping or wheezing  CV: no obvious cyanosis  MS: moves all visible extremities without noticeable abnormality  PSYCH/NEURO: pleasant and cooperative, no obvious depression or anxiety, speech and thought processing grossly intact  ASSESSMENT AND PLAN:  Discussed the following assessment and plan:  Problem List Items Addressed This Visit       Musculoskeletal and Integument   Hydradenitis    Uncontrolled, chronic. No systemic features. She declines seeing dermatology and strongly advised her to reconsider this for optimal treatment, prevention of flares. Advised clindamycin topical. She declines clindamycin topical and prefers to use topical bactroban which has worked for her in the past. She didn't tolerate or complete kelfex 2 months ago. She will  resume topical bactroban and if no improvement, will prescribe oral antibiotic, likely clindamycin.          Other   Anxiety and depression    Uncontrolled. Will start with atarax 10mg  bid.  She declines SSRI, in particular zoloft 50mg . We had a long discussion as it relates to therapeutic benefit of SSRI as it relates to her symptoms. She will consider this in the future.  Declines counseling referral at this time. Counseled on patient having frank discussion with loved ones, mother and fiance, as it relates to storing gun safety and the importance of this as for her and other's safety. Close follow up and she has a scheduled follow up with Dr in August.        Relevant Medications   hydrOXYzine (ATARAX/VISTARIL) 10 MG tablet    -we discussed possible serious and likely etiologies, options for evaluation and workup, limitations of telemedicine visit vs in person visit, treatment, treatment risks and precautions. Pt prefers to treat via telemedicine empirically rather then risking or undertaking an in person visit at this moment.  .   I discussed the assessment and treatment plan with the patient. The patient was provided an opportunity to ask questions and all were answered. The patient agreed with the plan and demonstrated an understanding of the instructions.   The patient was advised to call back or seek an in-person evaluation if the symptoms worsen or if the condition fails to improve as anticipated.   Lorin Picket, FNP   I have spent 25 minutes with a patient including precharting, exam, reviewing medical records, and discussion plan of care.

## 2020-10-15 ENCOUNTER — Telehealth: Payer: BLUE CROSS/BLUE SHIELD | Admitting: Internal Medicine

## 2020-10-20 ENCOUNTER — Encounter: Payer: BLUE CROSS/BLUE SHIELD | Admitting: Internal Medicine

## 2020-10-31 ENCOUNTER — Telehealth: Payer: BLUE CROSS/BLUE SHIELD | Admitting: Internal Medicine

## 2020-11-04 ENCOUNTER — Telehealth (INDEPENDENT_AMBULATORY_CARE_PROVIDER_SITE_OTHER): Payer: BLUE CROSS/BLUE SHIELD | Admitting: Internal Medicine

## 2020-11-04 ENCOUNTER — Encounter: Payer: Self-pay | Admitting: Internal Medicine

## 2020-11-04 DIAGNOSIS — F32A Depression, unspecified: Secondary | ICD-10-CM

## 2020-11-04 DIAGNOSIS — F419 Anxiety disorder, unspecified: Secondary | ICD-10-CM | POA: Diagnosis not present

## 2020-11-04 NOTE — Progress Notes (Signed)
Patient ID: Julie Hester, female   DOB: 08/09/1995, 25 y.o.   MRN: [id number]   Virtual Visit via video Note  This visit type was conducted due to national recommendations for restrictions regarding the COVID-19 pandemic (e.g. social distancing).  This format is felt to be most appropriate for this patient at this time.  All issues noted in this document were discussed and addressed.  No physical exam was performed (except for noted visual exam findings with Video Visits).   I connected with Consuella Lose by a video enabled telemedicine application and verified that I am speaking with the correct person using two identifiers. Location patient: home Location provider: work  Persons participating in the virtual visit: patient, provider  The limitations, risks, security and privacy concerns of performing an evaluation and management service by video and the availability of in person appointments have been discussed. It has also been discussed with the patient that there may be a patient responsible charge related to this service. The patient expressed understanding and agreed to proceed.   Reason for visit:  follow up appt  HPI: Follow up regarding anxiety and depression.  States she has been having bad nightmares lately.  This is leading to increased depression.  States has been worse over the last couple of weeks.  She is tired.  She usually goes to be around 3-5 AM and will get up around 7:30-8:00 when her boyfriend wakes up.  She will then fall back asleep around 9:30 and sleep until 1-2 PM.  Has had issues with increased anxiety. She is going out more now.  Is writing some.  Enjoys writing.  Likes to [employer], but has been eating out more.  Not exercising as much.  She has had 5-10 panic attacks in the last 2-3 weeks.  Is unable to tell me specific triggers.  No current suicidal ideations.  She does report increased depression.  Has appt with  Dr Maryruth Bun tomorrow.     ROS: See pertinent  positives and negatives per HPI.  Past Medical History:  Diagnosis Date   Asthma    As a child   Attention deficit disorder     History reviewed. No pertinent surgical history.  Family History  Problem Relation Age of Onset   Arthritis Mother    Diabetes Father    Cancer Maternal Grandmother        lung   Cancer Maternal Grandfather        lung   Cancer Paternal Grandmother        lung   Cancer Paternal Grandfather        lung    SOCIAL HX: reviewed.    Current Outpatient Medications:    hydrOXYzine (ATARAX/VISTARIL) 10 MG tablet, Take 1 tablet (10 mg total) by mouth 2 (two) times daily as needed for anxiety., Disp: 30 tablet, Rfl: 1   mupirocin ointment (BACTROBAN) 2 %, Apply to affected area bid, Disp: 22 g, Rfl: 0  EXAM:  GENERAL: alert, oriented, appears well and in no acute distress  HEENT: atraumatic, conjunttiva clear, no obvious abnormalities on inspection of external nose and ears  NECK: normal movements of the head and neck  LUNGS: on inspection no signs of respiratory distress, breathing rate appears normal, no obvious gross SOB, gasping or wheezing  CV: no obvious cyanosis  PSYCH/NEURO: pleasant and cooperative, no obvious depression or anxiety, speech and thought processing grossly intact  ASSESSMENT AND PLAN:  Discussed the following assessment and plan:  Problem List  Items Addressed This Visit     Anxiety and depression    Increased depression recently.  Describes bad nightmares.  No current suicidal ideations.  Also reports increased panic attacks recently.  Discussed treatment options.  She is resistant to starting SSRI.  Has tried buspar and hydroxyzine previously.  States she would "like something like hydroxyzine, but that helps to stabilize (her) mood".  Given she has appt with Dr Maryruth Bun tomorrow, will hold on starting a new medication this pm.  She will discuss above and further treatment with Dr Maryruth Bun.  She was comfortable with this plan.   Discussed continuing writing, cooking, etc.          Return if symptoms worsen or fail to improve, for will let me know when f/u is with Dr Maryruth Bun and will adjust f/u here accordingly. .   I discussed the assessment and treatment plan with the patient. The patient was provided an opportunity to ask questions and all were answered. The patient agreed with the plan and demonstrated an understanding of the instructions.   The patient was advised to call back or seek an in-person evaluation if the symptoms worsen or if the condition fails to improve as anticipated.    Dale [place], MD

## 2020-11-05 ENCOUNTER — Encounter: Payer: Self-pay | Admitting: Internal Medicine

## 2020-11-05 ENCOUNTER — Encounter: Payer: Self-pay | Admitting: Psychiatry

## 2020-11-05 NOTE — Assessment & Plan Note (Addendum)
Increased depression recently.  Describes bad nightmares.  No current suicidal ideations.  Also reports increased panic attacks recently.  Discussed treatment options.  She is resistant to starting SSRI.  Has tried buspar and hydroxyzine previously.  States she would "like something like hydroxyzine, but that helps to stabilize (her) mood".  Given she has appt with Dr Maryruth Bun tomorrow, will hold on starting a new medication this pm.  She will discuss above and further treatment with Dr Maryruth Bun.  She was comfortable with this plan.  Discussed continuing writing, cooking, etc.

## 2020-12-02 ENCOUNTER — Other Ambulatory Visit: Payer: Self-pay

## 2020-12-02 ENCOUNTER — Ambulatory Visit (INDEPENDENT_AMBULATORY_CARE_PROVIDER_SITE_OTHER): Payer: BLUE CROSS/BLUE SHIELD | Admitting: Internal Medicine

## 2020-12-02 VITALS — BP 124/76 | HR 96 | Temp 97.8°F | Resp 16 | Ht 64.0 in | Wt 202.0 lb

## 2020-12-02 DIAGNOSIS — R7989 Other specified abnormal findings of blood chemistry: Secondary | ICD-10-CM

## 2020-12-02 DIAGNOSIS — L732 Hidradenitis suppurativa: Secondary | ICD-10-CM

## 2020-12-02 DIAGNOSIS — E871 Hypo-osmolality and hyponatremia: Secondary | ICD-10-CM | POA: Diagnosis not present

## 2020-12-02 DIAGNOSIS — Z0000 Encounter for general adult medical examination without abnormal findings: Secondary | ICD-10-CM | POA: Diagnosis not present

## 2020-12-02 DIAGNOSIS — R945 Abnormal results of liver function studies: Secondary | ICD-10-CM | POA: Diagnosis not present

## 2020-12-02 DIAGNOSIS — D72829 Elevated white blood cell count, unspecified: Secondary | ICD-10-CM

## 2020-12-02 DIAGNOSIS — F419 Anxiety disorder, unspecified: Secondary | ICD-10-CM

## 2020-12-02 DIAGNOSIS — R5383 Other fatigue: Secondary | ICD-10-CM

## 2020-12-02 DIAGNOSIS — D509 Iron deficiency anemia, unspecified: Secondary | ICD-10-CM

## 2020-12-02 DIAGNOSIS — F333 Major depressive disorder, recurrent, severe with psychotic symptoms: Secondary | ICD-10-CM

## 2020-12-02 NOTE — Progress Notes (Signed)
Patient ID: Julie Hester, female   DOB: 09-11-1995, 25 y.o.   MRN: [id number]   Subjective:    Patient ID: Julie Hester, female    DOB: [birth year], 25 y.o.   MRN: [id number]  This visit occurred during the SARS-CoV-2 public health emergency.  Safety protocols were in place, including screening questions prior to the visit, additional usage of staff PPE, and extensive cleaning of exam room while observing appropriate contact time as indicated for disinfecting solutions.   Patient here for her physical exam.  Chief Complaint  Patient presents with   Annual Exam   .   HPI Physical today.  Declines pap/pelvic.  Recently saw Dr Maryruth Bun.  Reviewed note.  Diagnosed with PTSD and depression.  Also reported increased alcohol intake.  She was prescribed effexor, risperdone and hydroxyzine.  Also recommended to start B12 injections.  She has not started any of these medications.  Discussed with her today.  She has been previously resistant to taking SSRIs.  Discussed effexor is SNRI.  Sleep is an issue.  She is considering trial of risperdone.  Has f/u planned with therapist Wil[facility]) tomorrow.  Saw for one visit so far.  Went well.  Discussed the need for continued f/u.  Discussed the need to stay active.  Discussed diet and exercise - exercise as stress release, etc.  No chest pain.  Breathing stable.  Eating.  No nausea or vomiting.  Bowels moving.  Discussed relationship.  She has decreased her alcohol intake and plans to decrease more.  Persistent problems - hydradenitis.  Discussed oral abx and referral to surgery.  She declines.     Past Medical History:  Diagnosis Date   Asthma    As a child   Attention deficit disorder    History reviewed. No pertinent surgical history. Family History  Problem Relation Age of Onset   Arthritis Mother    Diabetes Father    Cancer Maternal Grandmother        lung   Cancer Maternal Grandfather        lung   Cancer Paternal Grandmother        lung    Cancer Paternal Grandfather        lung   Social History   Socioeconomic History   Marital status: Single    Spouse name: Not on file   Number of children: Not on file   Years of education: Not on file   Highest education level: Not on file  Occupational History   Not on file  Tobacco Use   Smoking status: Every Day    Types: Cigarettes   Smokeless tobacco: Never  Substance and Sexual Activity   Alcohol use: No    Alcohol/week: 0.0 standard drinks   Drug use: No   Sexual activity: Never  Other Topics Concern   Not on file  Social History Narrative   Not on file   Social Determinants of Health   Financial Resource Strain: Not on file  Food Insecurity: Not on file  Transportation Needs: Not on file  Physical Activity: Not on file  Stress: Not on file  Social Connections: Not on file    Review of Systems  Constitutional:  Negative for appetite change and unexpected weight change.  HENT:  Negative for congestion, sinus pressure and sore throat.   Eyes:  Negative for pain and visual disturbance.  Respiratory:  Negative for cough, chest tightness and shortness of breath.   Cardiovascular:  Negative for chest  pain, palpitations and leg swelling.  Gastrointestinal:  Negative for abdominal pain, diarrhea, nausea and vomiting.  Genitourinary:  Negative for difficulty urinating and dysuria.  Musculoskeletal:  Negative for joint swelling and myalgias.  Skin:  Negative for color change and rash.  Neurological:  Negative for dizziness, light-headedness and headaches.  Hematological:  Negative for adenopathy. Does not bruise/bleed easily.  Psychiatric/Behavioral:         Increased anxiety as outlined.  Saw psychiatry.  Depression.        Objective:     BP 124/76   Pulse 96   Temp 97.8 F (36.6 C)   Resp 16   Ht 5\' 4"  (1.626 m)   Wt 202 lb (91.6 kg)   SpO2 98%   BMI 34.67 kg/m  Wt Readings from Last 3 Encounters:  12/02/20 202 lb (91.6 kg)  11/04/20 202 lb (91.6  kg)  08/28/20 199 lb (90.3 kg)    Physical Exam Vitals reviewed.  Constitutional:      General: She is not in acute distress.    Appearance: Normal appearance.  HENT:     Head: Normocephalic and atraumatic.     Right Ear: External ear normal.     Left Ear: External ear normal.  Eyes:     General: No scleral icterus.       Right eye: No discharge.        Left eye: No discharge.     Conjunctiva/sclera: Conjunctivae normal.  Neck:     Thyroid: No thyromegaly.  Cardiovascular:     Rate and Rhythm: Normal rate and regular rhythm.  Pulmonary:     Effort: No respiratory distress.     Breath sounds: Normal breath sounds. No wheezing.     Comments: Breasts:  no nipple discharge or nipple retraction present.  Could not appreciate any distinct nodules.  Hydradenitis - increased erythema and tenderness - bilateral axilla.   Abdominal:     General: Bowel sounds are normal.     Palpations: Abdomen is soft.     Tenderness: There is no abdominal tenderness.  Musculoskeletal:        General: No swelling or tenderness.     Cervical back: Neck supple. No tenderness.  Lymphadenopathy:     Cervical: No cervical adenopathy.  Skin:    Findings: No erythema or rash.  Neurological:     Mental Status: She is alert.  Psychiatric:        Mood and Affect: Mood normal.        Behavior: Behavior normal.    Outpatient Encounter Medications as of 12/02/2020  Medication Sig   hydrOXYzine (ATARAX/VISTARIL) 10 MG tablet Take 1 tablet (10 mg total) by mouth 2 (two) times daily as needed for anxiety.   mupirocin ointment (BACTROBAN) 2 % Apply to affected area bid   No facility-administered encounter medications on file as of 12/02/2020.     Lab Results  Component Value Date   WBC 10.8 (H) 12/02/2020   HGB 14.6 12/02/2020   HCT 43.4 12/02/2020   PLT 437.0 (H) 12/02/2020   GLUCOSE 104 (H) 12/02/2020   CHOL 152 08/04/2020   TRIG 82.0 08/04/2020   HDL 39.30 08/04/2020   LDLCALC 97 08/04/2020   ALT  59 (H) 12/02/2020   AST 69 (H) 12/02/2020   NA 135 12/02/2020   K 4.1 12/04/2020   CL 99 12/02/2020   CREATININE 0.64 12/02/2020   BUN 4 (L) 12/02/2020   CO2 27 12/02/2020   TSH 3.91  08/04/2020   HGBA1C  07/09/2008    5.0 (NOTE)   The ADA recommends the following therapeutic goal for glycemic   control related to Hgb A1C measurement:   Goal of Therapy:   < 7.0% Hgb A1C   Reference: American Diabetes Association: Clinical Practice   Recommendations 2008, Diabetes Care,  2008, 31:(Suppl 1).       Assessment & Plan:   Problem List Items Addressed This Visit     Anxiety    Increased anxiety.  Is going out more.  Saw psychiatry as outlined.  Has not started medication.  Discussed the need for f/u.  Keep therapy appt scheduled tomorrow.        Fatigue    Increased fatigue and difficulty sleeping.  On questioning, stays up at night and sleeps through the day.  Saw psychiatry.  Prescribed medication as outlined.  Considering trial of risperidone.  Follow. Check routine labs.        Relevant Orders   hCG, quantitative, pregnancy (Completed)   Health care maintenance    Physical today 12/02/20.  She declines pap smear.        Hydradenitis    Chronic issue for her.  Uncontrolled.  Discussed need for oral abx.  She declines.  Agreed to continued bactroban only.  Discussed the need for surgery referra/dermatology referral.  She declines.  Follow.        Iron deficiency anemia    Follow cbc and iron studies.       Leukocytosis    Felt to be related to hydradenitis.  Recheck cbc.  Treat as outlined.  Declines oral abx.       Major depressive disorder, recurrent (HCC)    Increased anxiety and depression as outlined.  Sleeping difficulty.  Sleeping during day and staying up at night.  Saw Dr Maryruth Bun.  Prescribed effexor and risperidone.  Has not started.  Is contemplating starting.  Discussed need for treatment.  Has f/u with her therapist (second appt) tomorrow.  Has decreased alcohol  intake.  Desires to see a different psychiatrist. Discussed.        Other Visit Diagnoses     Routine general medical examination at a health care facility    -  Primary   Hyponatremia       Abnormal liver function tests            Dale [place], MD

## 2020-12-03 ENCOUNTER — Other Ambulatory Visit: Payer: Self-pay | Admitting: Internal Medicine

## 2020-12-03 DIAGNOSIS — E875 Hyperkalemia: Secondary | ICD-10-CM

## 2020-12-03 DIAGNOSIS — D7589 Other specified diseases of blood and blood-forming organs: Secondary | ICD-10-CM

## 2020-12-03 LAB — BASIC METABOLIC PANEL
BUN: 4 mg/dL — ABNORMAL LOW (ref 6–23)
CO2: 27 mEq/L (ref 19–32)
Calcium: 9.7 mg/dL (ref 8.4–10.5)
Chloride: 99 mEq/L (ref 96–112)
Creatinine, Ser: 0.64 mg/dL (ref 0.40–1.20)
GFR: 121.53 mL/min (ref >60.00)
Glucose, Bld: 104 mg/dL — ABNORMAL HIGH (ref 70–99)
Potassium: 5.2 mEq/L — ABNORMAL HIGH (ref 3.5–5.1)
Sodium: 135 mEq/L (ref 135–145)

## 2020-12-03 LAB — CBC WITH DIFFERENTIAL/PLATELET
Basophils Absolute: 0.1 10*3/uL (ref 0.0–0.1)
Basophils Relative: 0.9 % (ref 0.0–3.0)
Eosinophils Absolute: 0.1 10*3/uL (ref 0.0–0.7)
Eosinophils Relative: 1.2 % (ref 0.0–5.0)
HCT: 43.4 % (ref 36.0–46.0)
Hemoglobin: 14.6 g/dL (ref 12.0–15.0)
Lymphocytes Relative: 17 % (ref 12.0–46.0)
Lymphs Abs: 1.8 10*3/uL (ref 0.7–4.0)
MCHC: 33.6 g/dL (ref 30.0–36.0)
MCV: 106.6 fl — ABNORMAL HIGH (ref 78.0–100.0)
Monocytes Absolute: 0.8 10*3/uL (ref 0.1–1.0)
Monocytes Relative: 7.1 % (ref 3.0–12.0)
Neutro Abs: 8 10*3/uL — ABNORMAL HIGH (ref 1.4–7.7)
Neutrophils Relative %: 73.8 % (ref 43.0–77.0)
Platelets: 437 10*3/uL — ABNORMAL HIGH (ref 150.0–400.0)
RBC: 4.07 Mil/uL (ref 3.87–5.11)
RDW: 15.4 % (ref 11.5–15.5)
WBC: 10.8 10*3/uL — ABNORMAL HIGH (ref 4.0–10.5)

## 2020-12-03 LAB — HEPATIC FUNCTION PANEL
ALT: 59 U/L — ABNORMAL HIGH (ref 0–35)
AST: 69 U/L — ABNORMAL HIGH (ref 0–37)
Albumin: 4 g/dL (ref 3.5–5.2)
Alkaline Phosphatase: 63 U/L (ref 39–117)
Bilirubin, Direct: 0.1 mg/dL (ref 0.0–0.3)
Total Bilirubin: 0.5 mg/dL (ref 0.2–1.2)
Total Protein: 8 g/dL (ref 6.0–8.3)

## 2020-12-03 LAB — HCG, QUANTITATIVE, PREGNANCY: Quantitative HCG: <0.6 m[IU]/mL

## 2020-12-03 NOTE — Progress Notes (Signed)
Order placed for f/u labs.  

## 2020-12-04 ENCOUNTER — Other Ambulatory Visit (INDEPENDENT_AMBULATORY_CARE_PROVIDER_SITE_OTHER): Payer: BLUE CROSS/BLUE SHIELD

## 2020-12-04 ENCOUNTER — Other Ambulatory Visit: Payer: Self-pay

## 2020-12-04 DIAGNOSIS — E875 Hyperkalemia: Secondary | ICD-10-CM | POA: Diagnosis not present

## 2020-12-04 DIAGNOSIS — D7589 Other specified diseases of blood and blood-forming organs: Secondary | ICD-10-CM

## 2020-12-04 LAB — POTASSIUM: Potassium: 4.1 mEq/L (ref 3.5–5.1)

## 2020-12-05 LAB — VITAMIN B12: Vitamin B-12: >1550 pg/mL — ABNORMAL HIGH (ref 211–911)

## 2020-12-07 ENCOUNTER — Encounter: Payer: Self-pay | Admitting: Internal Medicine

## 2020-12-07 DIAGNOSIS — D72829 Elevated white blood cell count, unspecified: Secondary | ICD-10-CM | POA: Insufficient documentation

## 2020-12-07 DIAGNOSIS — F419 Anxiety disorder, unspecified: Secondary | ICD-10-CM | POA: Insufficient documentation

## 2020-12-07 NOTE — Assessment & Plan Note (Signed)
Chronic issue for her.  Uncontrolled.  Discussed need for oral abx.  She declines.  Agreed to continued bactroban only.  Discussed the need for surgery referra/dermatology referral.  She declines.  Follow.

## 2020-12-07 NOTE — Assessment & Plan Note (Signed)
Follow cbc and iron studies.  

## 2020-12-07 NOTE — Assessment & Plan Note (Signed)
Increased anxiety and depression as outlined.  Sleeping difficulty.  Sleeping during day and staying up at night.  Saw Dr Maryruth Bun.  Prescribed effexor and risperidone.  Has not started.  Is contemplating starting.  Discussed need for treatment.  Has f/u with her therapist (second appt) tomorrow.  Has decreased alcohol intake.  Desires to see a different psychiatrist. Discussed.

## 2020-12-07 NOTE — Assessment & Plan Note (Signed)
Physical today 12/02/20.  She declines pap smear.

## 2020-12-07 NOTE — Assessment & Plan Note (Signed)
Increased fatigue and difficulty sleeping.  On questioning, stays up at night and sleeps through the day.  Saw psychiatry.  Prescribed medication as outlined.  Considering trial of risperidone.  Follow. Check routine labs.

## 2020-12-07 NOTE — Assessment & Plan Note (Signed)
Felt to be related to hydradenitis.  Recheck cbc.  Treat as outlined.  Declines oral abx.

## 2020-12-07 NOTE — Assessment & Plan Note (Signed)
Increased anxiety.  Is going out more.  Saw psychiatry as outlined.  Has not started medication.  Discussed the need for f/u.  Keep therapy appt scheduled tomorrow.

## 2021-01-05 ENCOUNTER — Encounter: Payer: Self-pay | Admitting: Internal Medicine

## 2021-01-06 NOTE — Telephone Encounter (Signed)
LMTCB

## 2021-01-06 NOTE — Telephone Encounter (Signed)
Unable to do a work note given I have not seen him.  If he has been out on medical leave, he would need to get a note from the physician who put him out on leave.

## 2021-01-07 NOTE — Telephone Encounter (Signed)
Patient's mother calling in and informed. Patient's mother verbalized understanding and will inform the Patient

## 2021-02-23 ENCOUNTER — Ambulatory Visit: Payer: BLUE CROSS/BLUE SHIELD | Admitting: Internal Medicine

## 2021-05-19 ENCOUNTER — Ambulatory Visit: Payer: BLUE CROSS/BLUE SHIELD | Admitting: Internal Medicine

## 2021-08-04 ENCOUNTER — Encounter: Payer: Self-pay | Admitting: Internal Medicine

## 2021-08-04 ENCOUNTER — Ambulatory Visit (INDEPENDENT_AMBULATORY_CARE_PROVIDER_SITE_OTHER): Payer: 59 | Admitting: Internal Medicine

## 2021-08-04 DIAGNOSIS — F419 Anxiety disorder, unspecified: Secondary | ICD-10-CM | POA: Diagnosis not present

## 2021-08-04 DIAGNOSIS — D509 Iron deficiency anemia, unspecified: Secondary | ICD-10-CM

## 2021-08-04 DIAGNOSIS — F333 Major depressive disorder, recurrent, severe with psychotic symptoms: Secondary | ICD-10-CM | POA: Diagnosis not present

## 2021-08-04 DIAGNOSIS — L732 Hidradenitis suppurativa: Secondary | ICD-10-CM | POA: Diagnosis not present

## 2021-08-04 NOTE — Progress Notes (Signed)
Patient ID: Julie Hester, female   DOB: 09-12-1995, 26 y.o.   MRN: [id number] ? ? ?Subjective:  ? ? Patient ID: Julie Hester, female    DOB: Jun 26, 1995, 26 y.o.   MRN: [id number] ? ?This visit occurred during the SARS-CoV-2 public health emergency.  Safety protocols were in place, including screening questions prior to the visit, additional usage of staff PPE, and extensive cleaning of exam room while observing appropriate contact time as indicated for disinfecting solutions.  ? ?Patient here for a scheduled follow up.  ? ?Chief Complaint  ?Patient presents with  ? Follow-up  ? Anxiety  ? Depression  ? .  ? ?HPI ?With increased stress and anxiety issues.  Previously saw psychiatry.  Does not want to follow up.  Discussed.  She is writing.  Discussed staying active.  No chest pain or sob reported.  Does have persistent hidradenitis.  Discussed concern regarding infection and need for oral abx.  Declines oral abx.  Agreeable to bactroban.  Occasional acid reflux.  No vomiting.  LMP approximately one month ago.  Does drink wine.  States drinking approximately 4 glasses per night.  Discussed need to decrease amount.  Overall she feels she is handling things relatively well.  ? ? ?Past Medical History:  ?Diagnosis Date  ? Asthma   ? As a child  ? Attention deficit disorder   ? ?History reviewed. No pertinent surgical history. ?Family History  ?Problem Relation Age of Onset  ? Arthritis Mother   ? Diabetes Father   ? Cancer Maternal Grandmother   ?     lung  ? Cancer Maternal Grandfather   ?     lung  ? Cancer Paternal Grandmother   ?     lung  ? Cancer Paternal Grandfather   ?     lung  ? ?Social History  ? ?Socioeconomic History  ? Marital status: Single  ?  Spouse name: Not on file  ? Number of children: Not on file  ? Years of education: Not on file  ? Highest education level: Not on file  ?Occupational History  ? Not on file  ?Tobacco Use  ? Smoking status: Every Day  ?  Types: Cigarettes  ? Smokeless  tobacco: Never  ?Substance and Sexual Activity  ? Alcohol use: No  ?  Alcohol/week: 0.0 standard drinks  ? Drug use: No  ? Sexual activity: Never  ?Other Topics Concern  ? Not on file  ?Social History Narrative  ? Not on file  ? ?Social Determinants of Health  ? ?Financial Resource Strain: Not on file  ?Food Insecurity: Not on file  ?Transportation Needs: Not on file  ?Physical Activity: Not on file  ?Stress: Not on file  ?Social Connections: Not on file  ? ? ? ?Review of Systems  ?Constitutional:  Negative for appetite change and unexpected weight change.  ?HENT:  Negative for congestion and sinus pressure.   ?Respiratory:  Negative for cough, chest tightness and shortness of breath.   ?Cardiovascular:  Negative for chest pain, palpitations and leg swelling.  ?Gastrointestinal:  Negative for abdominal pain, diarrhea and vomiting.  ?     Occasional nausea.   ?Genitourinary:  Negative for difficulty urinating and dysuria.  ?Musculoskeletal:  Negative for joint swelling and myalgias.  ?Skin:   ?     Hidradenitis - multiple axillary lesions.   ?Neurological:  Negative for dizziness, light-headedness and headaches.  ?Psychiatric/Behavioral:  Negative for agitation and dysphoric mood.   ?  Increased stress and anxiety as outlined.   ? ?   ?Objective:  ?  ? ?BP 136/88 (BP Location: Left Arm, Patient Position: Sitting, Cuff Size: Large)   Pulse 97   Temp 98.7 ?F (37.1 ?C) (Temporal)   Resp 20   Ht 5\' 4"  (1.626 m)   Wt 215 lb 9.6 oz (97.8 kg)   SpO2 99%   BMI 37.01 kg/m?  ?Wt Readings from Last 3 Encounters:  ?08/04/21 215 lb 9.6 oz (97.8 kg)  ?12/02/20 202 lb (91.6 kg)  ?11/04/20 202 lb (91.6 kg)  ? ? ?Physical Exam ?Vitals reviewed.  ?Constitutional:   ?   General: She is not in acute distress. ?   Appearance: Normal appearance.  ?HENT:  ?   Head: Normocephalic and atraumatic.  ?   Right Ear: External ear normal.  ?   Left Ear: External ear normal.  ?Eyes:  ?   General: No scleral icterus.    ?   Right eye: No  discharge.     ?   Left eye: No discharge.  ?   Conjunctiva/sclera: Conjunctivae normal.  ?Neck:  ?   Thyroid: No thyromegaly.  ?Cardiovascular:  ?   Rate and Rhythm: Normal rate and regular rhythm.  ?Pulmonary:  ?   Effort: No respiratory distress.  ?   Breath sounds: Normal breath sounds. No wheezing.  ?Abdominal:  ?   General: Bowel sounds are normal.  ?   Palpations: Abdomen is soft.  ?   Tenderness: There is no abdominal tenderness.  ?Musculoskeletal:     ?   General: No swelling or tenderness.  ?   Cervical back: Neck supple. No tenderness.  ?Lymphadenopathy:  ?   Cervical: No cervical adenopathy.  ?Skin: ?   Comments: Hidradenitis - multiple axillary lesions.  Some surrounding erythema.   ?Neurological:  ?   Mental Status: She is alert.  ?Psychiatric:     ?   Mood and Affect: Mood normal.     ?   Behavior: Behavior normal.  ? ? ? ?Outpatient Encounter Medications as of 08/04/2021  ?Medication Sig  ? hydrOXYzine (ATARAX/VISTARIL) 10 MG tablet Take 1 tablet (10 mg total) by mouth 2 (two) times daily as needed for anxiety.  ? mupirocin ointment (BACTROBAN) 2 % Apply to affected area bid  ? ?No facility-administered encounter medications on file as of 08/04/2021.  ?  ? ?Lab Results  ?Component Value Date  ? WBC 10.8 (H) 12/02/2020  ? HGB 14.6 12/02/2020  ? HCT 43.4 12/02/2020  ? PLT 437.0 (H) 12/02/2020  ? GLUCOSE 104 (H) 12/02/2020  ? CHOL 152 08/04/2020  ? TRIG 82.0 08/04/2020  ? HDL 39.30 08/04/2020  ? LDLCALC 97 08/04/2020  ? ALT 59 (H) 12/02/2020  ? AST 69 (H) 12/02/2020  ? NA 135 12/02/2020  ? K 4.1 12/04/2020  ? CL 99 12/02/2020  ? CREATININE 0.64 12/02/2020  ? BUN 4 (L) 12/02/2020  ? CO2 27 12/02/2020  ? TSH 3.91 08/04/2020  ? HGBA1C  07/09/2008  ?  5.0 ?(NOTE)   The ADA recommends the following therapeutic goal for glycemic   control related to Hgb A1C measurement:   Goal of Therapy:   < 7.0% Hgb A1C   Reference: American Diabetes Association: Clinical Practice   Recommendations 2008, Diabetes Care,  ?2008,  31:(Suppl 1).  ? ? ?   ?Assessment & Plan:  ? ?Problem List Items Addressed This Visit   ? ? Anxiety  ?  Increased anxiety.  Is going out more.  Saw psychiatry as outlined.  Discussed the need for f/u.  Declines.  Follow.  ? ?  ?  ? Hydradenitis  ?  Chronic issue for her.  Uncontrolled.  Discussed need for oral abx.  She declines.  Agreed to continued bactroban only.  Discussed the need for surgery referra/dermatology referral.  She declines.  Follow.   ? ?  ?  ? Iron deficiency anemia  ?  Follow cbc and iron studies.  ? ?  ?  ? Major depressive disorder, recurrent (HCC)  ?  Increased anxiety.  Discussed.  Desires not to follow up with previous psychiatrist.  Writing.  Has good support.  Will notify me if feels needs any further intervention.  ? ?  ?  ? ? ? ?Dale Galloway, MD  ?

## 2021-08-15 ENCOUNTER — Encounter: Payer: Self-pay | Admitting: Internal Medicine

## 2021-08-15 NOTE — Assessment & Plan Note (Signed)
Chronic issue for her.  Uncontrolled.  Discussed need for oral abx.  She declines.  Agreed to continued bactroban only.  Discussed the need for surgery referra/dermatology referral.  She declines.  Follow.   

## 2021-08-15 NOTE — Assessment & Plan Note (Signed)
Follow cbc and iron studies.  

## 2021-08-15 NOTE — Assessment & Plan Note (Signed)
Increased anxiety.  Is going out more.  Saw psychiatry as outlined.  Discussed the need for f/u.  Declines.  Follow.  ?

## 2021-08-15 NOTE — Assessment & Plan Note (Signed)
Increased anxiety.  Discussed.  Desires not to follow up with previous psychiatrist.  Writing.  Has good support.  Will notify me if feels needs any further intervention.  ?

## 2021-12-08 ENCOUNTER — Encounter: Payer: 59 | Admitting: Internal Medicine

## 2022-03-30 ENCOUNTER — Telehealth: Payer: Medicare Other | Admitting: Family Medicine

## 2022-03-30 DIAGNOSIS — Z91199 Patient's noncompliance with other medical treatment and regimen due to unspecified reason: Secondary | ICD-10-CM

## 2022-03-30 NOTE — Progress Notes (Signed)
Unable to reach for virtual visit. Called multiple times, sent links to # and email and waited in virtual space until 10 minutes past appt time. Unable to leave message due to vm not set up.

## 2022-04-05 ENCOUNTER — Ambulatory Visit: Payer: Self-pay

## 2022-05-26 ENCOUNTER — Encounter: Payer: Self-pay | Admitting: Internal Medicine

## 2022-05-27 NOTE — Telephone Encounter (Signed)
Please confirm doing ok.  Given her issues, she needs to be evaluated.  I can refer to gyn or she can schedule an appt with me - may need to refer to gyn.

## 2022-05-27 NOTE — Telephone Encounter (Signed)
Called patient. Unable to leave message.

## 2022-06-21 ENCOUNTER — Ambulatory Visit (INDEPENDENT_AMBULATORY_CARE_PROVIDER_SITE_OTHER): Payer: Medicare HMO | Admitting: Internal Medicine

## 2022-06-21 VITALS — BP 136/72 | HR 99 | Temp 98.0°F | Resp 16 | Ht 64.5 in | Wt 214.0 lb

## 2022-06-21 DIAGNOSIS — Z13220 Encounter for screening for lipoid disorders: Secondary | ICD-10-CM | POA: Diagnosis not present

## 2022-06-21 DIAGNOSIS — N921 Excessive and frequent menstruation with irregular cycle: Secondary | ICD-10-CM

## 2022-06-21 DIAGNOSIS — F419 Anxiety disorder, unspecified: Secondary | ICD-10-CM

## 2022-06-21 DIAGNOSIS — F333 Major depressive disorder, recurrent, severe with psychotic symptoms: Secondary | ICD-10-CM

## 2022-06-21 DIAGNOSIS — R5383 Other fatigue: Secondary | ICD-10-CM

## 2022-06-21 DIAGNOSIS — D72829 Elevated white blood cell count, unspecified: Secondary | ICD-10-CM | POA: Diagnosis not present

## 2022-06-21 NOTE — Progress Notes (Signed)
Subjective:    Patient ID: Julie Hester, female    DOB: 1996/03/12, 27 y.o.   MRN: [id number]  Patient here for  Chief Complaint  Patient presents with   Stress   Menstrual Problem    HPI Work in appt - work in for evaluation - increased menstrual bleeding.  Reports previously having two months of constant bleeding.  LMP 2/26.  Period lasted 7-10 days.  Increased stress - with her relationship.  Discussed.  Has appt with therapist tomorrow.  Sleeping 4-6.5 hours per night.  She is eating healthier.  Decreased diarrhea.  No SI.     Past Medical History:  Diagnosis Date   Asthma    As a child   Attention deficit disorder    History reviewed. No pertinent surgical history. Family History  Problem Relation Age of Onset   Arthritis Mother    Diabetes Father    Cancer Maternal Grandmother        lung   Cancer Maternal Grandfather        lung   Cancer Paternal Grandmother        lung   Cancer Paternal Grandfather        lung   Social History   Socioeconomic History   Marital status: Single    Spouse name: Not on file   Number of children: Not on file   Years of education: Not on file   Highest education level: Not on file  Occupational History   Not on file  Tobacco Use   Smoking status: Every Day    Types: Cigarettes   Smokeless tobacco: Never  Substance and Sexual Activity   Alcohol use: No    Alcohol/week: 0.0 standard drinks of alcohol   Drug use: No   Sexual activity: Never  Other Topics Concern   Not on file  Social History Narrative   Not on file   Social Determinants of Health   Financial Resource Strain: Not on file  Food Insecurity: Not on file  Transportation Needs: Not on file  Physical Activity: Not on file  Stress: Not on file  Social Connections: Not on file     Review of Systems  Constitutional:  Negative for appetite change and unexpected weight change.  HENT:  Negative for congestion and sinus pressure.   Respiratory:   Negative for cough, chest tightness and shortness of breath.   Cardiovascular:  Negative for chest pain and palpitations.  Gastrointestinal:  Negative for abdominal pain, nausea and vomiting.  Genitourinary:  Negative for difficulty urinating and dysuria.       Menorrhagia  Musculoskeletal:  Negative for joint swelling and myalgias.  Skin:  Negative for color change and rash.  Neurological:  Negative for dizziness and headaches.  Psychiatric/Behavioral:         Increased stress as outlined.         Objective:     BP 136/72   Pulse 99   Temp 98 F (36.7 C)   Resp 16   Ht 5' 4.5" (1.638 m)   Wt 214 lb (97.1 kg)   SpO2 98%   BMI 36.17 kg/m  Wt Readings from Last 3 Encounters:  06/21/22 214 lb (97.1 kg)  08/04/21 215 lb 9.6 oz (97.8 kg)  12/02/20 202 lb (91.6 kg)    Physical Exam Vitals reviewed.  Constitutional:      General: She is not in acute distress.    Appearance: Normal appearance.  HENT:  Head: Normocephalic and atraumatic.     Right Ear: External ear normal.     Left Ear: External ear normal.  Eyes:     General: No scleral icterus.       Right eye: No discharge.        Left eye: No discharge.     Conjunctiva/sclera: Conjunctivae normal.  Neck:     Thyroid: No thyromegaly.  Cardiovascular:     Rate and Rhythm: Normal rate and regular rhythm.  Pulmonary:     Effort: No respiratory distress.     Breath sounds: Normal breath sounds. No wheezing.  Abdominal:     General: Bowel sounds are normal.     Palpations: Abdomen is soft.     Tenderness: There is no abdominal tenderness.  Musculoskeletal:        General: No swelling or tenderness.     Cervical back: Neck supple. No tenderness.  Lymphadenopathy:     Cervical: No cervical adenopathy.  Skin:    Findings: No erythema or rash.  Neurological:     Mental Status: She is alert.  Psychiatric:        Mood and Affect: Mood normal.        Behavior: Behavior normal.      Outpatient Encounter  Medications as of 06/21/2022  Medication Sig   mupirocin ointment (BACTROBAN) 2 % Apply to affected area bid   [DISCONTINUED] hydrOXYzine (ATARAX/VISTARIL) 10 MG tablet Take 1 tablet (10 mg total) by mouth 2 (two) times daily as needed for anxiety.   No facility-administered encounter medications on file as of 06/21/2022.     Lab Results  Component Value Date   WBC 10.8 (H) 12/02/2020   HGB 14.6 12/02/2020   HCT 43.4 12/02/2020   PLT 437.0 (H) 12/02/2020   GLUCOSE 104 (H) 12/02/2020   CHOL 152 08/04/2020   TRIG 82.0 08/04/2020   HDL 39.30 08/04/2020   LDLCALC 97 08/04/2020   ALT 59 (H) 12/02/2020   AST 69 (H) 12/02/2020   NA 135 12/02/2020   K 4.1 12/04/2020   CL 99 12/02/2020   CREATININE 0.64 12/02/2020   BUN 4 (L) 12/02/2020   CO2 27 12/02/2020   TSH 3.91 08/04/2020   HGBA1C  07/09/2008    5.0 (NOTE)   The ADA recommends the following therapeutic goal for glycemic   control related to Hgb A1C measurement:   Goal of Therapy:   < 7.0% Hgb A1C   Reference: American Diabetes Association: Clinical Practice   Recommendations 2008, Diabetes Care,  2008, 31:(Suppl 1).    No results found.     Assessment & Plan:  Leukocytosis, unspecified type Assessment & Plan: Felt to be related to hydradenitis previously.  Recheck cbc.    Orders: -     CBC with Differential/Platelet; Future  Other fatigue Assessment & Plan: Feel is multifactorial.  Discussed increased stress.  Discussed psychiatry evaluation.  Appt with therapist tomorrow.  Follow.   Orders: -     TSH; Future  Screening cholesterol level -     Lipid panel; Future -     Comprehensive metabolic panel; Future  Anxiety Assessment & Plan: Increased stress. Discussed.  Appt with therapist tomorrow.  Discussed psychiatry evaluation.     Severe episode of recurrent major depressive disorder, with psychotic features ([place]) Assessment & Plan: Increased anxiety and stress as outlined.  Discussed relationship issues.   Discussed need for f/u with psychiatry.  Has appt with therapist tomorrow.  Follow.  Menorrhagia with irregular cycle Assessment & Plan: Bleeding as outlined.  Discussed need for pelvic exam and gyn evaluation.  Declines pelvic exam.  Will check routine labs.  Bleeding is currently better than previous.  Follow. Notify if agreeable for referral.       Einar Pheasant, MD

## 2022-06-27 ENCOUNTER — Encounter: Payer: Self-pay | Admitting: Internal Medicine

## 2022-06-27 DIAGNOSIS — N920 Excessive and frequent menstruation with regular cycle: Secondary | ICD-10-CM | POA: Insufficient documentation

## 2022-06-27 NOTE — Assessment & Plan Note (Signed)
Feel is multifactorial.  Discussed increased stress.  Discussed psychiatry evaluation.  Appt with therapist tomorrow.  Follow.

## 2022-06-27 NOTE — Assessment & Plan Note (Signed)
Felt to be related to hydradenitis previously.  Recheck cbc.

## 2022-06-27 NOTE — Assessment & Plan Note (Signed)
Increased stress. Discussed.  Appt with therapist tomorrow.  Discussed psychiatry evaluation.

## 2022-06-27 NOTE — Assessment & Plan Note (Signed)
Bleeding as outlined.  Discussed need for pelvic exam and gyn evaluation.  Declines pelvic exam.  Will check routine labs.  Bleeding is currently better than previous.  Follow. Notify if agreeable for referral.

## 2022-06-27 NOTE — Assessment & Plan Note (Signed)
Increased anxiety and stress as outlined.  Discussed relationship issues.  Discussed need for f/u with psychiatry.  Has appt with therapist tomorrow.  Follow.

## 2022-06-28 ENCOUNTER — Other Ambulatory Visit (INDEPENDENT_AMBULATORY_CARE_PROVIDER_SITE_OTHER): Payer: Medicare HMO

## 2022-06-28 DIAGNOSIS — R5383 Other fatigue: Secondary | ICD-10-CM | POA: Diagnosis not present

## 2022-06-28 DIAGNOSIS — D72829 Elevated white blood cell count, unspecified: Secondary | ICD-10-CM | POA: Diagnosis not present

## 2022-06-28 DIAGNOSIS — Z13220 Encounter for screening for lipoid disorders: Secondary | ICD-10-CM | POA: Diagnosis not present

## 2022-06-29 LAB — LIPID PANEL
Cholesterol: 193 mg/dL (ref 0–200)
HDL: 35.2 mg/dL — ABNORMAL LOW (ref >39.00)
LDL Cholesterol: 134 mg/dL — ABNORMAL HIGH (ref 0–99)
NonHDL: 157.72
Total CHOL/HDL Ratio: 5
Triglycerides: 119 mg/dL (ref 0.0–149.0)
VLDL: 23.8 mg/dL (ref 0.0–40.0)

## 2022-06-29 LAB — COMPREHENSIVE METABOLIC PANEL
ALT: 55 U/L — ABNORMAL HIGH (ref 0–35)
AST: 73 U/L — ABNORMAL HIGH (ref 0–37)
Albumin: 4 g/dL (ref 3.5–5.2)
Alkaline Phosphatase: 67 U/L (ref 39–117)
BUN: 3 mg/dL — ABNORMAL LOW (ref 6–23)
CO2: 26 mEq/L (ref 19–32)
Calcium: 9.4 mg/dL (ref 8.4–10.5)
Chloride: 93 mEq/L — ABNORMAL LOW (ref 96–112)
Creatinine, Ser: 0.46 mg/dL (ref 0.40–1.20)
GFR: 130.16 mL/min (ref >60.00)
Glucose, Bld: 122 mg/dL — ABNORMAL HIGH (ref 70–99)
Potassium: 4.5 mEq/L (ref 3.5–5.1)
Sodium: 129 mEq/L — ABNORMAL LOW (ref 135–145)
Total Bilirubin: 0.5 mg/dL (ref 0.2–1.2)
Total Protein: 8.2 g/dL (ref 6.0–8.3)

## 2022-06-29 LAB — CBC WITH DIFFERENTIAL/PLATELET
Basophils Absolute: 0.1 10*3/uL (ref 0.0–0.1)
Basophils Relative: 0.7 % (ref 0.0–3.0)
Eosinophils Absolute: 0.1 10*3/uL (ref 0.0–0.7)
Eosinophils Relative: 0.7 % (ref 0.0–5.0)
HCT: 40.6 % (ref 36.0–46.0)
Hemoglobin: 13.5 g/dL (ref 12.0–15.0)
Lymphocytes Relative: 16.7 % (ref 12.0–46.0)
Lymphs Abs: 2.1 10*3/uL (ref 0.7–4.0)
MCHC: 33.3 g/dL (ref 30.0–36.0)
MCV: 101.7 fl — ABNORMAL HIGH (ref 78.0–100.0)
Monocytes Absolute: 0.9 10*3/uL (ref 0.1–1.0)
Monocytes Relative: 6.7 % (ref 3.0–12.0)
Neutro Abs: 9.6 10*3/uL — ABNORMAL HIGH (ref 1.4–7.7)
Neutrophils Relative %: 75.2 % (ref 43.0–77.0)
Platelets: 543 10*3/uL — ABNORMAL HIGH (ref 150.0–400.0)
RBC: 3.99 Mil/uL (ref 3.87–5.11)
RDW: 15.1 % (ref 11.5–15.5)
WBC: 12.8 10*3/uL — ABNORMAL HIGH (ref 4.0–10.5)

## 2022-06-29 LAB — TSH: TSH: 3.38 u[IU]/mL (ref 0.35–5.50)

## 2022-06-30 ENCOUNTER — Telehealth: Payer: Self-pay | Admitting: Internal Medicine

## 2022-06-30 ENCOUNTER — Other Ambulatory Visit: Payer: Self-pay

## 2022-06-30 ENCOUNTER — Other Ambulatory Visit: Payer: Self-pay | Admitting: Internal Medicine

## 2022-06-30 DIAGNOSIS — E871 Hypo-osmolality and hyponatremia: Secondary | ICD-10-CM

## 2022-06-30 DIAGNOSIS — D72829 Elevated white blood cell count, unspecified: Secondary | ICD-10-CM

## 2022-06-30 DIAGNOSIS — R7989 Other specified abnormal findings of blood chemistry: Secondary | ICD-10-CM

## 2022-06-30 NOTE — Progress Notes (Signed)
Order placed for abdominal ultrasound.   

## 2022-06-30 NOTE — Telephone Encounter (Signed)
See lab result note.

## 2022-06-30 NOTE — Telephone Encounter (Signed)
Pt wants to be called regarding her lab appointment tomorrow [phone]

## 2022-07-01 ENCOUNTER — Other Ambulatory Visit (INDEPENDENT_AMBULATORY_CARE_PROVIDER_SITE_OTHER): Payer: Medicare HMO

## 2022-07-01 DIAGNOSIS — E871 Hypo-osmolality and hyponatremia: Secondary | ICD-10-CM | POA: Diagnosis not present

## 2022-07-02 LAB — SODIUM: Sodium: 135 mmol/L (ref 135–146)

## 2022-07-12 ENCOUNTER — Other Ambulatory Visit (INDEPENDENT_AMBULATORY_CARE_PROVIDER_SITE_OTHER): Payer: Medicare HMO

## 2022-07-12 DIAGNOSIS — R7989 Other specified abnormal findings of blood chemistry: Secondary | ICD-10-CM

## 2022-07-12 DIAGNOSIS — D72829 Elevated white blood cell count, unspecified: Secondary | ICD-10-CM | POA: Diagnosis not present

## 2022-07-13 ENCOUNTER — Encounter: Payer: Self-pay | Admitting: *Deleted

## 2022-07-13 ENCOUNTER — Telehealth: Payer: Self-pay | Admitting: *Deleted

## 2022-07-13 LAB — CBC WITH DIFFERENTIAL/PLATELET
Absolute Monocytes: 735 cells/uL (ref 200–950)
Basophils Absolute: 59 cells/uL (ref 0–200)
Basophils Relative: 0.4 %
Eosinophils Absolute: 88 cells/uL (ref 15–500)
Eosinophils Relative: 0.6 %
HCT: 39.8 % (ref 35.0–45.0)
Hemoglobin: 13.5 g/dL (ref 11.7–15.5)
Lymphs Abs: 2220 cells/uL (ref 850–3900)
MCH: 32.9 pg (ref 27.0–33.0)
MCHC: 33.9 g/dL (ref 32.0–36.0)
MCV: 97.1 fL (ref 80.0–100.0)
MPV: 9.5 fL (ref 7.5–12.5)
Monocytes Relative: 5 %
Neutro Abs: 11598 cells/uL — ABNORMAL HIGH (ref 1500–7800)
Neutrophils Relative %: 78.9 %
Platelets: 508 10*3/uL — ABNORMAL HIGH (ref 140–400)
RBC: 4.1 10*6/uL (ref 3.80–5.10)
RDW: 12.1 % (ref 11.0–15.0)
Total Lymphocyte: 15.1 %
WBC: 14.7 10*3/uL — ABNORMAL HIGH (ref 3.8–10.8)

## 2022-07-13 LAB — HEPATIC FUNCTION PANEL
AG Ratio: 0.9 (calc) — ABNORMAL LOW (ref 1.0–2.5)
ALT: 39 U/L — ABNORMAL HIGH (ref 6–29)
AST: 43 U/L — ABNORMAL HIGH (ref 10–30)
Albumin: 3.8 g/dL (ref 3.6–5.1)
Alkaline phosphatase (APISO): 67 U/L (ref 31–125)
Bilirubin, Direct: 0.1 mg/dL (ref 0.0–0.2)
Globulin: 4.2 g/dL (calc) — ABNORMAL HIGH (ref 1.9–3.7)
Indirect Bilirubin: 0.4 mg/dL (calc) (ref 0.2–1.2)
Total Bilirubin: 0.5 mg/dL (ref 0.2–1.2)
Total Protein: 8 g/dL (ref 6.1–8.1)

## 2022-07-13 NOTE — Telephone Encounter (Signed)
Patient returned call from Thurmond Butts, CMA.  I read message from Dr. Dale Cokeburg to patient and scheduled patient to see Dr. Lorin Picket on 07/19/2022.

## 2022-07-13 NOTE — Telephone Encounter (Signed)
Noted  

## 2022-07-13 NOTE — Telephone Encounter (Signed)
Sent mychart message for pt to call for results-voicemail message has another first & last name on it so I did not leave a message there. Please have patient confirm number that we have on file. Results are below     Dale Osceola, MD 07/13/2022  4:55 AM EDT     Notify - white blood count is still elevated.  May be related to infection (hydradinitis).  Would like to schedule an appt to evaluate and see if treatment needed.  Liver function tests have improved.  Still slightly elevated, but improved.  Continue diet and exercise.  Also, avoid alcohol.  Already scheduled for abdominal ultrasound 07/20/22.

## 2022-07-19 ENCOUNTER — Encounter: Payer: Self-pay | Admitting: Internal Medicine

## 2022-07-19 ENCOUNTER — Ambulatory Visit (INDEPENDENT_AMBULATORY_CARE_PROVIDER_SITE_OTHER): Payer: Medicare HMO | Admitting: Internal Medicine

## 2022-07-19 VITALS — BP 120/72 | HR 96 | Temp 98.2°F | Resp 16 | Ht 64.5 in | Wt 214.0 lb

## 2022-07-19 DIAGNOSIS — F419 Anxiety disorder, unspecified: Secondary | ICD-10-CM

## 2022-07-19 DIAGNOSIS — F333 Major depressive disorder, recurrent, severe with psychotic symptoms: Secondary | ICD-10-CM | POA: Diagnosis not present

## 2022-07-19 DIAGNOSIS — D509 Iron deficiency anemia, unspecified: Secondary | ICD-10-CM | POA: Diagnosis not present

## 2022-07-19 DIAGNOSIS — N926 Irregular menstruation, unspecified: Secondary | ICD-10-CM

## 2022-07-19 DIAGNOSIS — L732 Hidradenitis suppurativa: Secondary | ICD-10-CM | POA: Diagnosis not present

## 2022-07-19 DIAGNOSIS — D72829 Elevated white blood cell count, unspecified: Secondary | ICD-10-CM

## 2022-07-19 NOTE — Progress Notes (Signed)
Subjective:    Patient ID: Julie Hester, female    DOB: 06-13-1995, 27 y.o.   MRN: [id number]  Patient here for  Chief Complaint  Patient presents with   Medical Management of Chronic Issues    HPI Work in appt.  Leukocytosis on recent labs.  History of increased problems wit hydradinitis.     Past Medical History:  Diagnosis Date   Asthma    As a child   Attention deficit disorder    History reviewed. No pertinent surgical history. Family History  Problem Relation Age of Onset   Arthritis Mother    Diabetes Father    Cancer Maternal Grandmother        lung   Cancer Maternal Grandfather        lung   Cancer Paternal Grandmother        lung   Cancer Paternal Grandfather        lung   Social History   Socioeconomic History   Marital status: Single    Spouse name: Not on file   Number of children: Not on file   Years of education: Not on file   Highest education level: Not on file  Occupational History   Not on file  Tobacco Use   Smoking status: Every Day    Types: Cigarettes   Smokeless tobacco: Never  Substance and Sexual Activity   Alcohol use: No    Alcohol/week: 0.0 standard drinks of alcohol   Drug use: No   Sexual activity: Never  Other Topics Concern   Not on file  Social History Narrative   Not on file   Social Determinants of Health   Financial Resource Strain: Not on file  Food Insecurity: Not on file  Transportation Needs: Not on file  Physical Activity: Not on file  Stress: Not on file  Social Connections: Not on file     Review of Systems     Objective:     BP 120/72   Pulse 96   Temp 98.2 F (36.8 C)   Resp 16   Ht 5' 4.5" (1.638 m)   Wt 214 lb (97.1 kg)   SpO2 98%   BMI 36.17 kg/m  Wt Readings from Last 3 Encounters:  07/19/22 214 lb (97.1 kg)  06/21/22 214 lb (97.1 kg)  08/04/21 215 lb 9.6 oz (97.8 kg)    Physical Exam   Outpatient Encounter Medications as of 07/19/2022  Medication Sig   mupirocin  ointment (BACTROBAN) 2 % Apply to affected area bid   No facility-administered encounter medications on file as of 07/19/2022.     Lab Results  Component Value Date   WBC 14.7 (H) 07/12/2022   HGB 13.5 07/12/2022   HCT 39.8 07/12/2022   PLT 508 (H) 07/12/2022   GLUCOSE 122 (H) 06/28/2022   CHOL 193 06/28/2022   TRIG 119.0 06/28/2022   HDL 35.20 (L) 06/28/2022   LDLCALC 134 (H) 06/28/2022   ALT 39 (H) 07/12/2022   AST 43 (H) 07/12/2022   NA 135 07/01/2022   K 4.5 06/28/2022   CL 93 (L) 06/28/2022   CREATININE 0.46 06/28/2022   BUN 3 (L) 06/28/2022   CO2 26 06/28/2022   TSH 3.38 06/28/2022   HGBA1C  07/09/2008    5.0 (NOTE)   The ADA recommends the following therapeutic goal for glycemic   control related to Hgb A1C measurement:   Goal of Therapy:   < 7.0% Hgb A1C   Reference: American  Diabetes Association: Clinical Practice   Recommendations 2008, Diabetes Care,  2008, 31:(Suppl 1).    No results found.     Assessment & Plan:  There are no diagnoses linked to this encounter.   Dale Peshtigo, MD

## 2022-07-20 ENCOUNTER — Ambulatory Visit
Admission: RE | Admit: 2022-07-20 | Discharge: 2022-07-20 | Disposition: A | Discharge status: Home / Self Care | Payer: Medicare HMO | Source: Ambulatory Visit | Attending: Internal Medicine | Admitting: Internal Medicine

## 2022-07-20 DIAGNOSIS — R7989 Other specified abnormal findings of blood chemistry: Secondary | ICD-10-CM | POA: Insufficient documentation

## 2022-07-20 DIAGNOSIS — K7689 Other specified diseases of liver: Secondary | ICD-10-CM | POA: Diagnosis not present

## 2022-07-20 LAB — BETA HCG QUANT (REF LAB): hCG Quant: <1 m[IU]/mL

## 2022-07-21 ENCOUNTER — Telehealth: Payer: Self-pay | Admitting: Internal Medicine

## 2022-07-21 ENCOUNTER — Other Ambulatory Visit: Payer: Self-pay

## 2022-07-21 ENCOUNTER — Encounter: Payer: Self-pay | Admitting: Internal Medicine

## 2022-07-21 MED ORDER — DOXYCYCLINE HYCLATE 100 MG PO TABS: 100.0000 mg | ORAL_TABLET | Freq: Two times a day (BID) | ORAL | 0 refills | Status: DC

## 2022-07-21 NOTE — Telephone Encounter (Signed)
Resent to [facility] [place]

## 2022-07-21 NOTE — Assessment & Plan Note (Signed)
Follow cbc and iron studies.  

## 2022-07-21 NOTE — Telephone Encounter (Signed)
Pt called stating her pharmacy is [facility] in [place]

## 2022-07-21 NOTE — Assessment & Plan Note (Signed)
Chronic issue for her.  Uncontrolled.  Discussed need for oral abx.  Pregnancy test negative.  Start doxycycline.  Has bactroban.  Discussed the need for surgery referra/dermatology referral.  Agreeable to dermatology referral.  Order for urgent referral placed.

## 2022-07-21 NOTE — Assessment & Plan Note (Signed)
Increased stress. Discussed.  Seeing a therapist two times per week. Discussed psychiatry evaluation.  She continues to want to hold on referral.  Will notify if changes her mind.

## 2022-07-21 NOTE — Assessment & Plan Note (Signed)
Felt to be related to hydradenitis.  Follow cbc.

## 2022-07-21 NOTE — Assessment & Plan Note (Signed)
Previous increased bleeding.  No period x 4-6 weeks.  Check pregnancy test.  Keep menstrual diary.  Addendum - negative.

## 2022-07-21 NOTE — Assessment & Plan Note (Signed)
Increased anxiety and stress as outlined.  Discussed relationship issues.  Discussed need for f/u with psychiatry. She declines.  Seeing her therapist 2x/week.  No SI. Follow.

## 2022-08-03 ENCOUNTER — Ambulatory Visit: Payer: Medicare HMO | Admitting: Internal Medicine

## 2022-08-04 ENCOUNTER — Encounter: Payer: Self-pay | Admitting: Internal Medicine

## 2022-08-04 ENCOUNTER — Other Ambulatory Visit: Payer: Self-pay | Admitting: Internal Medicine

## 2022-08-06 MED ORDER — DOXYCYCLINE HYCLATE 100 MG PO TABS: 100.0000 mg | ORAL_TABLET | Freq: Two times a day (BID) | ORAL | 0 refills | Status: DC

## 2022-09-10 ENCOUNTER — Encounter: Payer: Self-pay | Admitting: Internal Medicine

## 2022-09-10 ENCOUNTER — Ambulatory Visit (INDEPENDENT_AMBULATORY_CARE_PROVIDER_SITE_OTHER): Payer: Medicare HMO | Admitting: Internal Medicine

## 2022-09-10 VITALS — BP 128/72 | HR 89 | Temp 98.0°F | Resp 16 | Ht 64.5 in | Wt 211.0 lb

## 2022-09-10 DIAGNOSIS — L732 Hidradenitis suppurativa: Secondary | ICD-10-CM | POA: Diagnosis not present

## 2022-09-10 DIAGNOSIS — F333 Major depressive disorder, recurrent, severe with psychotic symptoms: Secondary | ICD-10-CM | POA: Diagnosis not present

## 2022-09-10 DIAGNOSIS — D509 Iron deficiency anemia, unspecified: Secondary | ICD-10-CM

## 2022-09-10 DIAGNOSIS — D72829 Elevated white blood cell count, unspecified: Secondary | ICD-10-CM | POA: Diagnosis not present

## 2022-09-10 DIAGNOSIS — F419 Anxiety disorder, unspecified: Secondary | ICD-10-CM | POA: Diagnosis not present

## 2022-09-10 DIAGNOSIS — N921 Excessive and frequent menstruation with irregular cycle: Secondary | ICD-10-CM

## 2022-09-10 LAB — CBC WITH DIFFERENTIAL/PLATELET
Absolute Monocytes: 780 cells/uL (ref 200–950)
Basophils Absolute: 48 cells/uL (ref 0–200)
Basophils Relative: 0.4 %
Eosinophils Absolute: 84 cells/uL (ref 15–500)
Eosinophils Relative: 0.7 %
HCT: 42.2 % (ref 35.0–45.0)
Hemoglobin: 14.1 g/dL (ref 11.7–15.5)
Lymphs Abs: 2076 cells/uL (ref 850–3900)
MCH: 32.6 pg (ref 27.0–33.0)
MCHC: 33.4 g/dL (ref 32.0–36.0)
MCV: 97.5 fL (ref 80.0–100.0)
MPV: 9.4 fL (ref 7.5–12.5)
Monocytes Relative: 6.5 %
Neutro Abs: 9012 cells/uL — ABNORMAL HIGH (ref 1500–7800)
Neutrophils Relative %: 75.1 %
Platelets: 404 10*3/uL — ABNORMAL HIGH (ref 140–400)
RBC: 4.33 10*6/uL (ref 3.80–5.10)
RDW: 14.1 % (ref 11.0–15.0)
Total Lymphocyte: 17.3 %
WBC: 12 10*3/uL — ABNORMAL HIGH (ref 3.8–10.8)

## 2022-09-10 NOTE — Progress Notes (Unsigned)
Subjective:    Patient ID: Julie Hester, female    DOB: 1995-07-12, 27 y.o.   MRN: [id number]  Patient here for  Chief Complaint  Patient presents with   Medical Management of Chronic Issues    HPI Here for f/u regarding increased anxiety/depression and f/u hydradenitis.  She appears to be doing some better.  Discussed relationship.  Is engaged.  Recently received a ring.  Seeing a Veterinary surgeon.  Discussed psychiatry referral.  Declines.  No SI.  Discussed exercise.  Plans to start pilates.  Hydradenitis - improved.  Took doxycycline.    Past Medical History:  Diagnosis Date   Asthma    As a child   Attention deficit disorder    History reviewed. No pertinent surgical history. Family History  Problem Relation Age of Onset   Arthritis Mother    Diabetes Father    Cancer Maternal Grandmother        lung   Cancer Maternal Grandfather        lung   Cancer Paternal Grandmother        lung   Cancer Paternal Grandfather        lung   Social History   Socioeconomic History   Marital status: Single    Spouse name: Not on file   Number of children: Not on file   Years of education: Not on file   Highest education level: Not on file  Occupational History   Not on file  Tobacco Use   Smoking status: Every Day    Types: Cigarettes   Smokeless tobacco: Never  Substance and Sexual Activity   Alcohol use: No    Alcohol/week: 0.0 standard drinks of alcohol   Drug use: No   Sexual activity: Never  Other Topics Concern   Not on file  Social History Narrative   Not on file   Social Determinants of Health   Financial Resource Strain: Not on file  Food Insecurity: Not on file  Transportation Needs: Not on file  Physical Activity: Not on file  Stress: Not on file  Social Connections: Not on file     Review of Systems  Constitutional:  Negative for appetite change and fever.  HENT:  Negative for congestion and sinus pressure.   Respiratory:  Negative for cough,  chest tightness and shortness of breath.   Cardiovascular:  Negative for chest pain, palpitations and leg swelling.  Gastrointestinal:  Negative for abdominal pain, diarrhea, nausea and vomiting.  Genitourinary:  Negative for difficulty urinating and dysuria.  Musculoskeletal:  Negative for joint swelling and myalgias.  Skin:  Negative for color change and rash.  Neurological:  Negative for dizziness and headaches.  Psychiatric/Behavioral:  Negative for agitation and dysphoric mood.        Objective:     BP 128/72   Pulse 89   Temp 98 F (36.7 C)   Resp 16   Ht 5' 4.5" (1.638 m)   Wt 211 lb (95.7 kg)   SpO2 98%   BMI 35.66 kg/m  Wt Readings from Last 3 Encounters:  09/10/22 211 lb (95.7 kg)  07/19/22 214 lb (97.1 kg)  06/21/22 214 lb (97.1 kg)    Physical Exam Vitals reviewed.  Constitutional:      General: She is not in acute distress.    Appearance: Normal appearance.  HENT:     Head: Normocephalic and atraumatic.     Right Ear: External ear normal.     Left Ear: External  ear normal.  Eyes:     General: No scleral icterus.       Right eye: No discharge.        Left eye: No discharge.     Conjunctiva/sclera: Conjunctivae normal.  Neck:     Thyroid: No thyromegaly.  Cardiovascular:     Rate and Rhythm: Normal rate and regular rhythm.  Pulmonary:     Effort: No respiratory distress.     Breath sounds: Normal breath sounds. No wheezing.  Abdominal:     General: Bowel sounds are normal.     Palpations: Abdomen is soft.     Tenderness: There is no abdominal tenderness.  Musculoskeletal:        General: No swelling or tenderness.     Cervical back: Neck supple. No tenderness.  Lymphadenopathy:     Cervical: No cervical adenopathy.  Skin:    Findings: No erythema or rash.     Comments: Hydradenitis - no increased erythema.  No drainage.   Neurological:     Mental Status: She is alert.  Psychiatric:        Mood and Affect: Mood normal.        Behavior:  Behavior normal.      Outpatient Encounter Medications as of 09/10/2022  Medication Sig   mupirocin ointment (BACTROBAN) 2 % Apply to affected area bid   [DISCONTINUED] doxycycline (VIBRA-TABS) 100 MG tablet Take 1 tablet (100 mg total) by mouth 2 (two) times daily.   No facility-administered encounter medications on file as of 09/10/2022.     Lab Results  Component Value Date   WBC 12.0 (H) 09/10/2022   HGB 14.1 09/10/2022   HCT 42.2 09/10/2022   PLT 404 (H) 09/10/2022   GLUCOSE 122 (H) 06/28/2022   CHOL 193 06/28/2022   TRIG 119.0 06/28/2022   HDL 35.20 (L) 06/28/2022   LDLCALC 134 (H) 06/28/2022   ALT 39 (H) 07/12/2022   AST 43 (H) 07/12/2022   NA 135 07/01/2022   K 4.5 06/28/2022   CL 93 (L) 06/28/2022   CREATININE 0.46 06/28/2022   BUN 3 (L) 06/28/2022   CO2 26 06/28/2022   TSH 3.38 06/28/2022   HGBA1C  07/09/2008    5.0 (NOTE)   The ADA recommends the following therapeutic goal for glycemic   control related to Hgb A1C measurement:   Goal of Therapy:   < 7.0% Hgb A1C   Reference: American Diabetes Association: Clinical Practice   Recommendations 2008, Diabetes Care,  2008, 31:(Suppl 1).    US Abdomen Complete  Result Date: 07/20/2022 CLINICAL DATA:  Abnormal LFTs. EXAM: ABDOMEN ULTRASOUND COMPLETE COMPARISON:  CT abdomen pelvis 08/16/2008 FINDINGS: Gallbladder: No gallstones or wall thickening visualized. No sonographic Murphy sign noted by sonographer. Common bile duct: Diameter: 1.8 mm Liver: Increased echogenicity. No focal lesion. Portal vein is patent on color Doppler imaging with normal direction of blood flow towards the liver. IVC: No abnormality visualized. Pancreas: Visualized portion unremarkable. Spleen: Size and appearance within normal limits. Right Kidney: Length: 12.6 cm. Echogenicity within normal limits. No mass or hydronephrosis visualized. Left Kidney: Length: 11.9 cm. Echogenicity within normal limits. No mass or hydronephrosis visualized. Abdominal  aorta: No aneurysm visualized. Other findings: None. IMPRESSION: 1. Increased hepatic parenchymal echogenicity suggestive of steatosis. 2. No cholelithiasis or sonographic evidence for acute cholecystitis. Electronically Signed   By: Annia Belt M.D.   On: 07/20/2022 16:55       Assessment & Plan:  Leukocytosis, unspecified type Assessment & Plan:  Feel related to infection/hydradenitis.  Improved now.  Recheck cbc today.   Orders: -     CBC with Differential/Platelet  Anxiety Assessment & Plan: Increased stress. Discussed.  Has been seeing a therapist two times per week. Discussed psychiatry evaluation.  She continues to want to hold on referral.  Will notify if changes her mind.    Hydradenitis Assessment & Plan: Recent treated with doxycycline.  Improved. No increased erythema or drainage.  Follow.  F/u regarding referral.     Iron deficiency anemia, unspecified iron deficiency anemia type Assessment & Plan: Follow cbc and iron studies.    Severe episode of recurrent major depressive disorder, with psychotic features (HCC) Assessment & Plan: Increased anxiety and stress as outlined.  Discussed relationship issues.  Discussed need for f/u with psychiatry. She declines.  Seeing her therapist 2x/week.  No SI. Follow.    Menorrhagia with irregular cycle Assessment & Plan: Bleeding has improved some.  Follow.  Keep menstrual diary.      Dale Henefer, MD

## 2022-09-11 ENCOUNTER — Encounter: Payer: Self-pay | Admitting: Internal Medicine

## 2022-09-11 NOTE — Assessment & Plan Note (Signed)
Follow cbc and iron studies.  

## 2022-09-11 NOTE — Assessment & Plan Note (Signed)
Recent treated with doxycycline.  Improved. No increased erythema or drainage.  Follow.  F/u regarding referral.

## 2022-09-11 NOTE — Assessment & Plan Note (Signed)
Feel related to infection/hydradenitis.  Improved now.  Recheck cbc today.

## 2022-09-11 NOTE — Assessment & Plan Note (Signed)
Bleeding has improved some.  Follow.  Keep menstrual diary.

## 2022-09-11 NOTE — Assessment & Plan Note (Signed)
Increased stress. Discussed.  Has been seeing a therapist two times per week. Discussed psychiatry evaluation.  She continues to want to hold on referral.  Will notify if changes her mind.

## 2022-09-11 NOTE — Assessment & Plan Note (Signed)
Increased anxiety and stress as outlined.  Discussed relationship issues.  Discussed need for f/u with psychiatry. She declines.  Seeing her therapist 2x/week.  No SI. Follow.  

## 2022-09-30 ENCOUNTER — Encounter: Payer: Self-pay | Admitting: Internal Medicine

## 2022-09-30 NOTE — Telephone Encounter (Signed)
Please call and confirm continuing to improve.  Confirm no other symptoms - fever, etc. She has a history of hydradenitis ([facility] - she was referring to).  Her neck looks inflamed.  Given concern regarding possible infection, would recommend evaluation - neck - to see if oral abx warranted.  Also, still recommend referral to surgery for evaluation as well due to her chronic issues with hydradenitis.

## 2022-09-30 NOTE — Telephone Encounter (Signed)
LM for patient to return call to discuss but wanted you to see this message.

## 2022-10-01 NOTE — Telephone Encounter (Signed)
Called patient. Unable to LM.  

## 2022-10-06 NOTE — Telephone Encounter (Signed)
Called pt. Unable to LM.

## 2022-11-17 ENCOUNTER — Ambulatory Visit (INDEPENDENT_AMBULATORY_CARE_PROVIDER_SITE_OTHER): Payer: Medicare HMO | Admitting: Emergency Medicine

## 2022-11-17 VITALS — Ht 64.5 in | Wt 206.0 lb

## 2022-11-17 DIAGNOSIS — Z0000 Encounter for general adult medical examination without abnormal findings: Secondary | ICD-10-CM | POA: Diagnosis not present

## 2022-11-17 NOTE — Progress Notes (Signed)
Subjective:   Julie Hester is a 27 y.o. female who presents for an Initial Medicare Annual Wellness Visit.  Visit Complete: Virtual  I connected with  Julie Hester on 11/17/22 by a audio enabled telemedicine application and verified that I am speaking with the correct person using two identifiers.  Patient Location: Home  Provider Location: Home Office  I discussed the limitations of evaluation and management by telemedicine. The patient expressed understanding and agreed to proceed.  Vital Signs: Unable to obtain new vitals due to this being a telehealth visit. Patient reported height and weight.   Review of Systems     Cardiac Risk Factors include: obesity (BMI >30kg/m2)     Objective:    Today's Vitals   11/17/22 1511 11/17/22 1512  Weight: 206 lb (93.4 kg)   Height: 5' 4.5" (1.638 m)   PainSc:  3    Body mass index is 34.81 kg/m.      No data to display           Current Medications (verified) Outpatient Encounter Medications as of 11/17/2022  Medication Sig   mupirocin ointment (BACTROBAN) 2 % Apply to affected area bid   No facility-administered encounter medications on file as of 11/17/2022.    Allergies (verified) Ibuprofen   History: Past Medical History:  Diagnosis Date   Asthma    As a child   Attention deficit disorder    Hydradenitis    Past Surgical History:  Procedure Laterality Date   NO PAST SURGERIES     Family History  Problem Relation Age of Onset   Arthritis Mother    Diabetes Father    Cancer Maternal Grandmother        lung   Cancer Maternal Grandfather        lung   Cancer Paternal Grandmother        lung   Cancer Paternal Grandfather        lung   Social History   Socioeconomic History   Marital status: Single    Spouse name: Not on file   Number of children: 0   Years of education: Not on file   Highest education level: Not on file  Occupational History   Occupation: disabled  Tobacco Use    Smoking status: Every Day    Current packs/day: 0.50    Average packs/day: 0.5 packs/day for 7.6 years (3.8 ttl pk-yrs)    Types: Cigarettes    Start date: 2017   Smokeless tobacco: Never  Vaping Use   Vaping status: Never Used  Substance and Sexual Activity   Alcohol use: Yes    Alcohol/week: 28.0 standard drinks of alcohol    Types: 14 Glasses of wine, 14 Standard drinks or equivalent per week    Comment: 4-5 drinks per day   Drug use: Yes    Types: Marijuana    Comment: smokes 1-2 times per week, edibles 2-3 times per week   Sexual activity: Yes    Birth control/protection: Condom    Comment: sometimes use condoms  Other Topics Concern   Not on file  Social History Narrative   Disabled. Is engaged. No children   Social Determinants of Health   Financial Resource Strain: Low Risk  (11/17/2022)   Overall Financial Resource Strain (CARDIA)    Difficulty of Paying Living Expenses: Not hard at all  Food Insecurity: No Food Insecurity (11/17/2022)   Hunger Vital Sign    Worried About Running Out of Food  in the Last Year: Never true    Ran Out of Food in the Last Year: Never true  Transportation Needs: No Transportation Needs (11/17/2022)   PRAPARE - Administrator, Civil Service (Medical): No    Lack of Transportation (Non-Medical): No  Physical Activity: Insufficiently Active (11/17/2022)   Exercise Vital Sign    Days of Exercise per Week: 1 day    Minutes of Exercise per Session: 20 min  Stress: Stress Concern Present (11/17/2022)   [employer] of Occupational Health - Occupational Stress Questionnaire    Feeling of Stress : Very much  Social Connections: Moderately Isolated (11/17/2022)   Social Connection and Isolation Panel [NHANES]    Frequency of Communication with Friends and Family: More than three times a week    Frequency of Social Gatherings with Friends and Family: Once a week    Attends Religious Services: Never    Database administrator or  Organizations: Yes    Attends Banker Meetings: Never    Marital Status: Never married    Tobacco Counseling Ready to quit: Not Answered Counseling given: Not Answered   Clinical Intake:  Pre-visit preparation completed: Yes  Pain : 0-10 Pain Score: 3  Pain Type: Acute pain Pain Location: Back Pain Descriptors / Indicators: Aching     BMI - recorded: 34.81 Nutritional Status: BMI > 30  Obese Nutritional Risks: None Diabetes: No  How often do you need to have someone help you when you read instructions, pamphlets, or other written materials from your doctor or pharmacy?: 1 - Never  Interpreter Needed?: No  Information entered by :: Tora Kindred, CMA   Activities of Daily Living    11/17/2022    3:15 PM  In your present state of health, do you have any difficulty performing the following activities:  Hearing? 0  Vision? 0  Difficulty concentrating or making decisions? 1  Walking or climbing stairs? 0  Dressing or bathing? 1  Comment sometimes mother will help  Doing errands, shopping? 1  Comment does not drive  Preparing Food and eating ? N  Using the Toilet? N  In the past six months, have you accidently leaked urine? N  Do you have problems with loss of bowel control? N  Managing your Medications? N  Managing your Finances? Y  Comment mother is in Electronics engineer or managing your Housekeeping? Y  Comment mother helps    Patient Care Team: Dale [place], MD as PCP - General (Internal Medicine)  Indicate any recent Medical Services you may have received from other than [facility] providers in the past year (date may be approximate).     Assessment:   This is a routine wellness examination for Julie Hester.  Hearing/Vision screen Hearing Screening - Comments:: Denies hearing loss Vision Screening - Comments:: Denies vision problems  Dietary issues and exercise activities discussed:     Goals Addressed               This  Visit's Progress     Patient Stated (pt-stated)        Losing weight and being healthier in general      Depression Screen    11/17/2022    3:33 PM 07/19/2022   11:36 AM 06/21/2022    2:11 PM 08/04/2021    3:48 PM 11/04/2020    3:56 PM 08/04/2020    3:50 PM 01/28/2017    2:47 PM  PHQ 2/9 Scores  PHQ -  2 Score 6 6 6 3 6  0 2  PHQ- 9 Score 17 20 15 13 16  6     Fall Risk    11/17/2022    3:44 PM 08/04/2021    3:48 PM 08/28/2020    4:10 PM 03/22/2016    3:10 PM 09/17/2014   10:41 AM  Fall Risk   Falls in the past year? 1 0 0 No No  Number falls in past yr: 0      Injury with Fall? 0      Risk for fall due to : History of fall(s) No Fall Risks     Follow up Falls prevention discussed;Falls evaluation completed Falls evaluation completed Falls evaluation completed      MEDICARE RISK AT HOME:  Medicare Risk at Home - 11/17/22 1544     Any stairs in or around the home? Yes    If so, are there any without handrails? No    Home free of loose throw rugs in walkways, pet beds, electrical cords, etc? Yes    Adequate lighting in your home to reduce risk of falls? Yes    Life alert? No    Use of a cane, walker or w/c? No    Grab bars in the bathroom? No    Shower chair or bench in shower? No    Elevated toilet seat or a handicapped toilet? No             TIMED UP AND GO:  Was the test performed? No    Cognitive Function:        11/17/2022    3:46 PM  6CIT Screen  What Year? 0 points  What month? 0 points  What time? 0 points  Count back from 20 0 points  Months in reverse 0 points  Repeat phrase 0 points  Total Score 0 points    Immunizations Immunization History  Administered Date(s) Administered   Influenza,inj,Quad PF,6+ Mos 02/07/2018    TDAP status: Due, Education has been provided regarding the importance of this vaccine. Advised may receive this vaccine at local pharmacy or Health Dept. Aware to provide a copy of the vaccination record if obtained from local  pharmacy or Health Dept. Verbalized acceptance and understanding.  Flu Vaccine status: Due, Education has been provided regarding the importance of this vaccine. Advised may receive this vaccine at local pharmacy or Health Dept. Aware to provide a copy of the vaccination record if obtained from local pharmacy or Health Dept. Verbalized acceptance and understanding.   Covid-19 vaccine status: Declined, Education has been provided regarding the importance of this vaccine but patient still declined. Advised may receive this vaccine at local pharmacy or Health Dept.or vaccine clinic. Aware to provide a copy of the vaccination record if obtained from local pharmacy or Health Dept. Verbalized acceptance and understanding.  Qualifies for Shingles Vaccine? No    Screening Tests Health Maintenance  Topic Date Due   HIV Screening  Never done   Hepatitis C Screening  Never done   DTaP/Tdap/Td (1 - Tdap) Never done   PAP-Cervical Cytology Screening  Never done   PAP SMEAR-Modifier  Never done   INFLUENZA VACCINE  11/04/2022   Medicare Annual Wellness (AWV)  11/17/2023   HPV VACCINES  Aged Out   COVID-19 Vaccine  Discontinued    Health Maintenance  Health Maintenance Due  Topic Date Due   HIV Screening  Never done   Hepatitis C Screening  Never done   DTaP/Tdap/Td (  1 - Tdap) Never done   PAP-Cervical Cytology Screening  Never done   PAP SMEAR-Modifier  Never done   INFLUENZA VACCINE  11/04/2022       Lung Cancer Screening: (Low Dose CT Chest recommended if Age 75-80 years, 20 pack-year currently smoking OR have quit w/in 15years.) does not qualify.   Lung Cancer Screening Referral: n/a  Additional Screening:  Hepatitis C Screening: does qualify;  will get with next lab orders  Vision Screening: Recommended annual ophthalmology exams for early detection of glaucoma and other disorders of the eye.  Dental Screening: Recommended annual dental exams for proper oral  hygiene   Community Resource Referral / Chronic Care Management: CRR required this visit?  No   CCM required this visit?  No     Plan:     I have personally reviewed and noted the following in the patient's chart:   Medical and social history Use of alcohol, tobacco or illicit drugs  Current medications and supplements including opioid prescriptions. Patient is not currently taking opioid prescriptions. Functional ability and status Nutritional status Physical activity Advanced directives List of other physicians Hospitalizations, surgeries, and ER visits in previous 12 months Vitals Screenings to include cognitive, depression, and falls Referrals and appointments  In addition, I have reviewed and discussed with patient certain preventive protocols, quality metrics, and best practice recommendations. A written personalized care plan for preventive services as well as general preventive health recommendations were provided to patient.     Tora Kindred, CMA   11/17/2022   After Visit Summary: (MyChart) Due to this being a telephonic visit, the after visit summary with patients personalized plan was offered to patient via MyChart   Nurse Notes:  Patient refused pap smear Patient needs Tdap. Will get flu shot in the fall. Refused covid vaccine. Will need Hep C screening with next labs. Patient's depression screening and stress level was positive. Patient is currently seeing a therapist, but is not on any medication. [phone] suicide and crisis support number given.

## 2022-11-17 NOTE — Patient Instructions (Addendum)
Ms. Judah , Thank you for taking time to come for your Medicare Wellness Visit. I appreciate your ongoing commitment to your health goals. Please review the following plan we discussed and let me know if I can assist you in the future.   Referrals/Orders/Follow-Ups/Clinician Recommendations: Get your flu shot in the fall. Get Hepatitis C screening with your next blood work. Will need a tetanus shot if you can't provide documentation of when your last shot was. Continue to see your therapist.  This is a list of the screening recommended for you and due dates:  Health Maintenance  Topic Date Due   HIV Screening  Never done   Hepatitis C Screening  Never done   DTaP/Tdap/Td vaccine (1 - Tdap) Never done   Pap Smear  Never done   Pap Smear  Never done   Flu Shot  11/04/2022   Medicare Annual Wellness Visit  11/17/2023   HPV Vaccine  Aged Out   COVID-19 Vaccine  Discontinued    Advanced directives: Information on Advanced Care Planning can be found at [facility] Advance Health Care Directives Advance Health Care Directives ([email or web address]    Next Medicare Annual Wellness Visit scheduled for next year: Yes, 11/23/23 @ 3:45pm  Preventive Care 38-57 Years Old, Female Preventive care refers to lifestyle choices and visits with your health care provider that can promote health and wellness. Preventive care visits are also called wellness exams. What can I expect for my preventive care visit? Counseling During your preventive care visit, your health care provider may ask about your: Medical history, including: Past medical problems. Family medical history. Pregnancy history. Current health, including: Menstrual cycle. Method of birth control. Emotional well-being. Home life and relationship well-being. Sexual activity and sexual health. Lifestyle, including: Alcohol, nicotine or tobacco, and drug use. Access to firearms. Diet, exercise, and sleep habits. Work and  work Astronomer. Sunscreen use. Safety issues such as seatbelt and bike helmet use. Physical exam Your health care provider may check your: Height and weight. These may be used to calculate your BMI (body mass index). BMI is a measurement that tells if you are at a healthy weight. Waist circumference. This measures the distance around your waistline. This measurement also tells if you are at a healthy weight and may help predict your risk of certain diseases, such as type 2 diabetes and high blood pressure. Heart rate and blood pressure. Body temperature. Skin for abnormal spots. What immunizations do I need? Vaccines are usually given at various ages, according to a schedule. Your health care provider will recommend vaccines for you based on your age, medical history, and lifestyle or other factors, such as travel or where you work. What tests do I need? Screening Your health care provider may recommend screening tests for certain conditions. This may include: Pelvic exam and Pap test. Lipid and cholesterol levels. Diabetes screening. This is done by checking your blood sugar (glucose) after you have not eaten for a while (fasting). Hepatitis B test. Hepatitis C test. HIV (human immunodeficiency virus) test. STI (sexually transmitted infection) testing, if you are at risk. BRCA-related cancer screening. This may be done if you have a family history of breast, ovarian, tubal, or peritoneal cancers. Talk with your health care provider about your test results, treatment options, and if necessary, the need for more tests. Follow these instructions at home: Eating and drinking  Eat a healthy diet that includes fresh fruits and vegetables, whole grains, lean protein, and low-fat dairy  products. Take vitamin and mineral supplements as recommended by your health care provider. Do not drink alcohol if: Your health care provider tells you not to drink. You are pregnant, may be pregnant, or are  planning to become pregnant. If you drink alcohol: Limit how much you have to 0-1 drink a day. Know how much alcohol is in your drink. In the [place] one drink equals one 12 oz bottle of beer (355 mL), one 5 oz glass of wine (148 mL), or one 1 oz glass of hard liquor (44 mL). Lifestyle Brush your teeth every morning and night with fluoride toothpaste. Floss one time each day. Exercise for at least 30 minutes 5 or more days each week. Do not use any products that contain nicotine or tobacco. These products include cigarettes, chewing tobacco, and vaping devices, such as e-cigarettes. If you need help quitting, ask your health care provider. Do not use drugs. If you are sexually active, practice safe sex. Use a condom or other form of protection to prevent STIs. If you do not wish to become pregnant, use a form of birth control. If you plan to become pregnant, see your health care provider for a prepregnancy visit. Find healthy ways to manage stress, such as: Meditation, yoga, or listening to music. Journaling. Talking to a trusted person. Spending time with friends and family. Minimize exposure to UV radiation to reduce your risk of skin cancer. Safety Always wear your seat belt while driving or riding in a vehicle. Do not drive: If you have been drinking alcohol. Do not ride with someone who has been drinking. If you have been using any mind-altering substances or drugs. While texting. When you are tired or distracted. Wear a helmet and other protective equipment during sports activities. If you have firearms in your house, make sure you follow all gun safety procedures. Seek help if you have been physically or sexually abused. What's next? Go to your health care provider once a year for an annual wellness visit. Ask your health care provider how often you should have your eyes and teeth checked. Stay up to date on all vaccines. This information is not intended to replace advice given  to you by your health care provider. Make sure you discuss any questions you have with your health care provider. Document Revised: 09/17/2020 Document Reviewed: 09/17/2020 Elsevier Patient Education  2022 Elsevier Inc.   Suicidal Feelings: How to Help Yourself Suicide is when you end your own life. Suicidal ideation includes expressing thoughts about, or a preoccupation with, ending your own life. There are many things you can do to help yourself feel better when struggling with these feelings. Many services and people are available to support you and others who struggle with similar feelings. If you ever feel like you may hurt yourself or others, or have thoughts about taking your own life, get help right away. To get help: Go to your nearest emergency department. Call your local emergency services (911 in the [place] Call the [employer] health and human services helpline (211 in the [place] Call or text a suicide hotline to speak with a trained counselor. The following suicide hotlines are available in the [place]: 1-800-273-TALK (1-(660)816-9280 or 988 in the [place] 1-800-SUICIDE 2536238968). Text 862-672-1696. This is the [employer] in the [place] 563-701-3724. This is a hotline for Spanish speakers. 414-221-0476. This is a hotline for TTY users. 1-866-4-U-TREVOR 801-305-0040). This is a hotline for lesbian, gay, bisexual, transgender, or questioning youth. For a  list of hotlines in [place], visit AmenCredit.is.html Contact a crisis center or a local suicide prevention center. To find a crisis center or suicide prevention center: Call your local hospital, clinic, community service organization, mental health center, social service provider, or health department. Ask for help with connecting to a crisis center. For a list of crisis centers in the [place], visit: suicidepreventionlifeline.org For a list of crisis centers in  [place], visit: suicideprevention.ca How to help yourself feel better  Promise yourself that you will not do anything bad or extreme when you have suicidal feelings. Remember the times you have felt hopeful. Many people have gotten through suicidal thoughts and feelings, and you can too. If you have had these feelings before, remind yourself that you can get through them again. Let family, friends, teachers, or counselors know how you are feeling. Do not separate yourself from those who care about you and want to help you. Talk with someone every day, even if you do not feel like talking to anyone or being with other people. Face-to-face conversation is best to help them understand your feelings. Contact a mental health care provider and work with this person regularly. Make a safety plan that you can follow during a crisis. Include phone numbers of suicide prevention hotlines, mental health professionals, and trusted friends and family members you can call during an emergency. Save these numbers on your phone. If you are thinking of taking a lot of medicine, give your medicine to someone who can give it to you as prescribed. If you are on antidepressants and are concerned you will overdose, tell your health care provider so that he or she can give you safer medicines. Try to stick to your routines and follow a schedule every day. Make self-care a priority. Make a list of realistic goals, and cross them off when you achieve them. Accomplishments can give you a sense of worth. Wait until you are feeling better before doing things that you find difficult or unpleasant. Do things that you have always enjoyed to take your mind off your feelings. Try reading a book, or listening to or playing music. Spending time outside, in nature, may help you feel better. Follow these instructions at home:  Visit your primary health care provider every year for a physical and a mental health checkup. Take  over-the-counter and prescription medicines only as told by your health care provider. Ask your health care provider about the possible side effects of any medicines you are taking. Ask your health care provider about whether suicidal ideation is a possible side effect of any of your medicines. Learn about suicidal ideation and what increases the risk for the development of suicidal thoughts. Eat a well-balanced diet, and eat regular meals. Get plenty of rest. Exercise if you are able. Just 30 minutes of exercise each day can help you feel better. Keep your living space well lit. Do not use alcohol or drugs. Remove these substances from your home. General recommendations Remove weapons, poisons, knives, and other deadly items from your home. Work with a mental health care provider as needed. When you are feeling well, write yourself a letter with tips and support that you can read when you are not feeling well. Remember that life's difficulties can be sorted out with help. Conditions can be treated, and you can learn behaviors and ways of thinking that will help you. Work with your health care provider or counselor to learn ways of coping with your thoughts and feelings.  Where to find more information National Suicide Prevention Lifeline: www.suicidepreventionlifeline.org Hopeline: www.hopeline.com [employer] for Suicide Prevention: https://www.ayers.com/ [employer] (for lesbian, gay, bisexual, transgender, or questioning youth): www.thetrevorproject.D[employer] of Mental Health: https://ramirez-williams.com/ Suicide Prevention Resources: FarmerBuys.com.au Contact a health care provider if: You feel as though you are a burden to others. You feel agitated, angry, vengeful, or have extreme mood swings. You have withdrawn from family and friends. You are frequently using drugs or alcohol. Get help right away if: You are talking about  suicide or wishing to die. You start making plans for how to commit suicide. You feel that you have no reason to live. You start making plans for putting your affairs in order, saying goodbye, or giving your possessions away. You feel guilt, shame, or unbearable pain, and it seems like there is no way out. You are engaging in risky behaviors that could lead to death. If you have any of these thoughts or symptoms, get help right away: Go to your nearest emergency department or crisis center. Call emergency services (911 in the [place] Call or text a suicide crisis helpline. Summary Suicide is when you take your own life. Suicidal feelings are thoughts about ending your own life. Promise yourself that you will not do anything bad or extreme when you have suicidal feelings. Let family, friends, teachers, or counselors know how you are feeling. Get help right away if you start making plans for how to commit suicide. This information is not intended to replace advice given to you by your health care provider. Make sure you discuss any questions you have with your health care provider. Document Revised: 10/16/2020 Document Reviewed: 07/31/2020 Elsevier Patient Education  2024 [employer].

## 2022-11-18 ENCOUNTER — Encounter (INDEPENDENT_AMBULATORY_CARE_PROVIDER_SITE_OTHER): Payer: Self-pay

## 2022-12-21 ENCOUNTER — Encounter: Payer: Self-pay | Admitting: Internal Medicine

## 2022-12-21 ENCOUNTER — Ambulatory Visit (INDEPENDENT_AMBULATORY_CARE_PROVIDER_SITE_OTHER): Payer: Medicare HMO | Admitting: Internal Medicine

## 2022-12-21 VITALS — BP 120/76 | HR 92 | Temp 98.2°F | Resp 16 | Ht 64.5 in | Wt 203.0 lb

## 2022-12-21 DIAGNOSIS — Z0000 Encounter for general adult medical examination without abnormal findings: Secondary | ICD-10-CM

## 2022-12-21 DIAGNOSIS — L732 Hidradenitis suppurativa: Secondary | ICD-10-CM | POA: Diagnosis not present

## 2022-12-21 DIAGNOSIS — F333 Major depressive disorder, recurrent, severe with psychotic symptoms: Secondary | ICD-10-CM

## 2022-12-21 DIAGNOSIS — D509 Iron deficiency anemia, unspecified: Secondary | ICD-10-CM

## 2022-12-21 DIAGNOSIS — F419 Anxiety disorder, unspecified: Secondary | ICD-10-CM | POA: Diagnosis not present

## 2022-12-21 MED ORDER — DOXYCYCLINE HYCLATE 100 MG PO TABS
100.0000 mg | ORAL_TABLET | Freq: Two times a day (BID) | ORAL | 0 refills | Status: DC
Start: 2022-12-21 — End: 2023-04-17

## 2022-12-21 NOTE — Progress Notes (Signed)
Subjective:    Patient ID: Julie Hester, female    DOB: Oct 27, 1995, 27 y.o.   MRN: [id number]  Patient here for  Chief Complaint  Patient presents with   Annual Exam    HPI Here for a physical exam.  Increased stress with her relationship.  Discussed.  Has a therapist.  Also discussed referral to psychiatry.  No SI. Will notify me when agreeable.  Breathing stable.  Does report recently noticing fever.  No cough or congestion.  With increased problems with hidradenitis - infection, drainage.  Has appt with dermatology tomorrow.  No fever currently.  No rash.    Past Medical History:  Diagnosis Date   Asthma    As a child   Attention deficit disorder    Hydradenitis    Past Surgical History:  Procedure Laterality Date   NO PAST SURGERIES     Family History  Problem Relation Age of Onset   Arthritis Mother    Diabetes Father    Cancer Maternal Grandmother        lung   Cancer Maternal Grandfather        lung   Cancer Paternal Grandmother        lung   Cancer Paternal Grandfather        lung   Social History   Socioeconomic History   Marital status: Single    Spouse name: Not on file   Number of children: 0   Years of education: Not on file   Highest education level: Not on file  Occupational History   Occupation: disabled  Tobacco Use   Smoking status: Every Day    Current packs/day: 0.50    Average packs/day: 0.5 packs/day for 7.7 years (3.9 ttl pk-yrs)    Types: Cigarettes    Start date: 2017   Smokeless tobacco: Never  Vaping Use   Vaping status: Never Used  Substance and Sexual Activity   Alcohol use: Yes    Alcohol/week: 28.0 standard drinks of alcohol    Types: 14 Glasses of wine, 14 Standard drinks or equivalent per week    Comment: 4-5 drinks per day   Drug use: Yes    Types: Marijuana    Comment: smokes 1-2 times per week, edibles 2-3 times per week   Sexual activity: Yes    Birth control/protection: Condom    Comment: sometimes use  condoms  Other Topics Concern   Not on file  Social History Narrative   Disabled. Is engaged. No children   Social Determinants of Health   Financial Resource Strain: Low Risk  (11/17/2022)   Overall Financial Resource Strain (CARDIA)    Difficulty of Paying Living Expenses: Not hard at all  Food Insecurity: No Food Insecurity (11/17/2022)   Hunger Vital Sign    Worried About Running Out of Food in the Last Year: Never true    Ran Out of Food in the Last Year: Never true  Transportation Needs: No Transportation Needs (11/17/2022)   PRAPARE - Administrator, Civil Service (Medical): No    Lack of Transportation (Non-Medical): No  Physical Activity: Insufficiently Active (11/17/2022)   Exercise Vital Sign    Days of Exercise per Week: 1 day    Minutes of Exercise per Session: 20 min  Stress: Stress Concern Present (11/17/2022)   [employer] of Occupational Health - Occupational Stress Questionnaire    Feeling of Stress : Very much  Social Connections: Moderately Isolated (11/17/2022)   Social  Connection and Isolation Panel [NHANES]    Frequency of Communication with Friends and Family: More than three times a week    Frequency of Social Gatherings with Friends and Family: Once a week    Attends Religious Services: Never    Database administrator or Organizations: Yes    Attends Banker Meetings: Never    Marital Status: Never married     Review of Systems  Constitutional:  Negative for fatigue and unexpected weight change.  HENT:  Negative for congestion, sinus pressure and sore throat.   Eyes:  Negative for pain and visual disturbance.  Respiratory:  Negative for cough, chest tightness and shortness of breath.   Cardiovascular:  Negative for chest pain and palpitations.  Gastrointestinal:  Negative for abdominal pain, constipation and diarrhea.  Genitourinary:  Negative for difficulty urinating and dysuria.  Musculoskeletal:  Negative for joint  swelling and myalgias.  Skin:  Negative for color change and rash.       Hidradenitis - axillar (bilateral) and inguinal (bilateral).  Oozing.  Some erythema.   Neurological:  Negative for dizziness and headaches.  Hematological:  Negative for adenopathy. Does not bruise/bleed easily.  Psychiatric/Behavioral:  Negative for agitation and dysphoric mood.        Objective:     BP 120/76   Pulse 92   Temp 98.2 F (36.8 C)   Resp 16   Ht 5' 4.5" (1.638 m)   Wt 203 lb (92.1 kg)   SpO2 98%   BMI 34.31 kg/m  Wt Readings from Last 3 Encounters:  12/21/22 203 lb (92.1 kg)  11/17/22 206 lb (93.4 kg)  09/10/22 211 lb (95.7 kg)    Physical Exam Vitals reviewed.  Constitutional:      General: She is not in acute distress.    Appearance: Normal appearance. She is well-developed.  HENT:     Head: Normocephalic and atraumatic.     Right Ear: External ear normal.     Left Ear: External ear normal.  Eyes:     General: No scleral icterus.       Right eye: No discharge.        Left eye: No discharge.     Conjunctiva/sclera: Conjunctivae normal.  Neck:     Thyroid: No thyromegaly.  Cardiovascular:     Rate and Rhythm: Normal rate and regular rhythm.  Pulmonary:     Effort: No tachypnea, accessory muscle usage or respiratory distress.     Breath sounds: Normal breath sounds. No decreased breath sounds, wheezing or rhonchi.  Chest:  Breasts:    Right: No inverted nipple, mass, nipple discharge or tenderness (no axillary adenopathy).     Left: No inverted nipple, mass, nipple discharge or tenderness (no axilarry adenopathy).  Abdominal:     General: Bowel sounds are normal.     Palpations: Abdomen is soft.     Tenderness: There is no abdominal tenderness.  Musculoskeletal:        General: No swelling or tenderness.     Cervical back: Neck supple.  Lymphadenopathy:     Cervical: No cervical adenopathy.  Skin:    Findings: No rash.     Comments: Hidradenitis - axillar (bilateral)  and inguinal (bilateral).  Oozing.  Some erythema.   Neurological:     Mental Status: She is alert and oriented to person, place, and time.  Psychiatric:        Mood and Affect: Mood normal.  Behavior: Behavior normal.      Outpatient Encounter Medications as of 12/21/2022  Medication Sig   doxycycline (VIBRA-TABS) 100 MG tablet Take 1 tablet (100 mg total) by mouth 2 (two) times daily.   mupirocin ointment (BACTROBAN) 2 % Apply to affected area bid   No facility-administered encounter medications on file as of 12/21/2022.     Lab Results  Component Value Date   WBC 12.0 (H) 09/10/2022   HGB 14.1 09/10/2022   HCT 42.2 09/10/2022   PLT 404 (H) 09/10/2022   GLUCOSE 122 (H) 06/28/2022   CHOL 193 06/28/2022   TRIG 119.0 06/28/2022   HDL 35.20 (L) 06/28/2022   LDLCALC 134 (H) 06/28/2022   ALT 39 (H) 07/12/2022   AST 43 (H) 07/12/2022   NA 135 07/01/2022   K 4.5 06/28/2022   CL 93 (L) 06/28/2022   CREATININE 0.46 06/28/2022   BUN 3 (L) 06/28/2022   CO2 26 06/28/2022   TSH 3.38 06/28/2022   HGBA1C  07/09/2008    5.0 (NOTE)   The ADA recommends the following therapeutic goal for glycemic   control related to Hgb A1C measurement:   Goal of Therapy:   < 7.0% Hgb A1C   Reference: American Diabetes Association: Clinical Practice   Recommendations 2008, Diabetes Care,  2008, 31:(Suppl 1).    US Abdomen Complete  Result Date: 07/20/2022 CLINICAL DATA:  Abnormal LFTs. EXAM: ABDOMEN ULTRASOUND COMPLETE COMPARISON:  CT abdomen pelvis 08/16/2008 FINDINGS: Gallbladder: No gallstones or wall thickening visualized. No sonographic Murphy sign noted by sonographer. Common bile duct: Diameter: 1.8 mm Liver: Increased echogenicity. No focal lesion. Portal vein is patent on color Doppler imaging with normal direction of blood flow towards the liver. IVC: No abnormality visualized. Pancreas: Visualized portion unremarkable. Spleen: Size and appearance within normal limits. Right Kidney:  Length: 12.6 cm. Echogenicity within normal limits. No mass or hydronephrosis visualized. Left Kidney: Length: 11.9 cm. Echogenicity within normal limits. No mass or hydronephrosis visualized. Abdominal aorta: No aneurysm visualized. Other findings: None. IMPRESSION: 1. Increased hepatic parenchymal echogenicity suggestive of steatosis. 2. No cholelithiasis or sonographic evidence for acute cholecystitis. Electronically Signed   By: Annia Belt M.D.   On: 07/20/2022 16:55       Assessment & Plan:  Anxiety Assessment & Plan: Increased stress. Discussed.  Has been seeing a therapist two times per week. Discussed psychiatry evaluation.  She continues to want to hold on referral.  Will notify if changes her mind.    Health care maintenance Assessment & Plan: Physical today 12/22/22.  She declines pap smear.     Hydradenitis Assessment & Plan: Increased flare as outlined.  Has appt with dermatology tomorrow.  Treat with doxycycline.  Wanted to hold on labs.  Follow closely.    Iron deficiency anemia, unspecified iron deficiency anemia type Assessment & Plan: Follow cbc and iron studies.    Severe episode of recurrent major depressive disorder, with psychotic features (HCC) Assessment & Plan: Increased anxiety and stress as outlined.  Discussed relationship issues.  Discussed need for f/u with psychiatry. She declines.  Seeing her therapist. No SI. Follow.    Other orders -     Doxycycline Hyclate; Take 1 tablet (100 mg total) by mouth 2 (two) times daily.  Dispense: 14 tablet; Refill: 0     Dale Fairview, MD

## 2022-12-22 DIAGNOSIS — D225 Melanocytic nevi of trunk: Secondary | ICD-10-CM | POA: Diagnosis not present

## 2022-12-22 DIAGNOSIS — L732 Hidradenitis suppurativa: Secondary | ICD-10-CM | POA: Diagnosis not present

## 2022-12-22 DIAGNOSIS — D2272 Melanocytic nevi of left lower limb, including hip: Secondary | ICD-10-CM | POA: Diagnosis not present

## 2022-12-26 ENCOUNTER — Encounter: Payer: Self-pay | Admitting: Internal Medicine

## 2022-12-26 NOTE — Assessment & Plan Note (Signed)
Increased flare as outlined.  Has appt with dermatology tomorrow.  Treat with doxycycline.  Wanted to hold on labs.  Follow closely.

## 2022-12-26 NOTE — Assessment & Plan Note (Signed)
Increased anxiety and stress as outlined.  Discussed relationship issues.  Discussed need for f/u with psychiatry. She declines.  Seeing her therapist. No SI. Follow.

## 2022-12-26 NOTE — Assessment & Plan Note (Signed)
Physical today 12/22/22.  She declines pap smear.

## 2022-12-26 NOTE — Assessment & Plan Note (Signed)
Increased stress. Discussed.  Has been seeing a therapist two times per week. Discussed psychiatry evaluation.  She continues to want to hold on referral.  Will notify if changes her mind.

## 2022-12-26 NOTE — Assessment & Plan Note (Signed)
Follow cbc and iron studies.

## 2023-03-23 DIAGNOSIS — L732 Hidradenitis suppurativa: Secondary | ICD-10-CM | POA: Diagnosis not present

## 2023-03-23 DIAGNOSIS — Z79899 Other long term (current) drug therapy: Secondary | ICD-10-CM | POA: Diagnosis not present

## 2023-03-25 ENCOUNTER — Ambulatory Visit (INDEPENDENT_AMBULATORY_CARE_PROVIDER_SITE_OTHER): Payer: Medicare HMO | Admitting: Internal Medicine

## 2023-03-25 VITALS — BP 124/70 | HR 88 | Temp 98.2°F | Resp 16 | Ht 64.5 in | Wt 200.0 lb

## 2023-03-25 DIAGNOSIS — F333 Major depressive disorder, recurrent, severe with psychotic symptoms: Secondary | ICD-10-CM | POA: Diagnosis not present

## 2023-03-25 DIAGNOSIS — F419 Anxiety disorder, unspecified: Secondary | ICD-10-CM | POA: Diagnosis not present

## 2023-03-25 DIAGNOSIS — L732 Hidradenitis suppurativa: Secondary | ICD-10-CM

## 2023-03-25 DIAGNOSIS — D72829 Elevated white blood cell count, unspecified: Secondary | ICD-10-CM

## 2023-03-25 NOTE — Progress Notes (Unsigned)
Subjective:    Patient ID: MENDI SWAPP, female    DOB: 01-12-1996, 27 y.o.   MRN: [id number]  Patient here for a scheduled follow up.   HPI Here for a scheduled follow up.  F/u regarding increased stress.  Also, hidradenitis. Seeing dermatology now for her hidradenitis. Is some better. Continue f/u with dermatology. Still with increased stress.  Stress with her current relationship. Seeing a therapist.  Assures me she would not hurt herself. Has an appt this pm. Discussed some of her issues with her current relationship. She does report that she is starting to set some boundaries. Also reports she has been writing more. Decreasing her amount of smoking. Breathing overall appears to be stable.    Past Medical History:  Diagnosis Date   Asthma    As a child   Attention deficit disorder    Hydradenitis    Past Surgical History:  Procedure Laterality Date   NO PAST SURGERIES     Family History  Problem Relation Age of Onset   Arthritis Mother    Diabetes Father    Cancer Maternal Grandmother        lung   Cancer Maternal Grandfather        lung   Cancer Paternal Grandmother        lung   Cancer Paternal Grandfather        lung   Social History   Socioeconomic History   Marital status: Single    Spouse name: Not on file   Number of children: 0   Years of education: Not on file   Highest education level: Not on file  Occupational History   Occupation: disabled  Tobacco Use   Smoking status: Every Day    Current packs/day: 0.50    Average packs/day: 0.5 packs/day for 8.0 years (4.0 ttl pk-yrs)    Types: Cigarettes    Start date: 2017   Smokeless tobacco: Never  Vaping Use   Vaping status: Never Used  Substance and Sexual Activity   Alcohol use: Yes    Alcohol/week: 28.0 standard drinks of alcohol    Types: 14 Glasses of wine, 14 Standard drinks or equivalent per week    Comment: 4-5 drinks per day   Drug use: Yes    Types: Marijuana    Comment: smokes 1-2  times per week, edibles 2-3 times per week   Sexual activity: Yes    Birth control/protection: Condom    Comment: sometimes use condoms  Other Topics Concern   Not on file  Social History Narrative   Disabled. Is engaged. No children   Social Drivers of Corporate investment banker Strain: Low Risk  (11/17/2022)   Overall Financial Resource Strain (CARDIA)    Difficulty of Paying Living Expenses: Not hard at all  Food Insecurity: No Food Insecurity (11/17/2022)   Hunger Vital Sign    Worried About Running Out of Food in the Last Year: Never true    Ran Out of Food in the Last Year: Never true  Transportation Needs: No Transportation Needs (11/17/2022)   PRAPARE - Administrator, Civil Service (Medical): No    Lack of Transportation (Non-Medical): No  Physical Activity: Insufficiently Active (11/17/2022)   Exercise Vital Sign    Days of Exercise per Week: 1 day    Minutes of Exercise per Session: 20 min  Stress: Stress Concern Present (11/17/2022)   [employer] of Occupational Health - Occupational Stress Questionnaire  Feeling of Stress : Very much  Social Connections: Moderately Isolated (11/17/2022)   Social Connection and Isolation Panel [NHANES]    Frequency of Communication with Friends and Family: More than three times a week    Frequency of Social Gatherings with Friends and Family: Once a week    Attends Religious Services: Never    Database administrator or Organizations: Yes    Attends Banker Meetings: Never    Marital Status: Never married     Review of Systems  Constitutional:  Negative for appetite change and unexpected weight change.  HENT:  Negative for congestion and sinus pressure.   Respiratory:  Negative for cough, chest tightness and shortness of breath.   Cardiovascular:  Negative for chest pain and palpitations.  Gastrointestinal:  Negative for abdominal pain, diarrhea, nausea and vomiting.  Genitourinary:  Negative for  difficulty urinating and dysuria.  Musculoskeletal:  Negative for joint swelling and myalgias.  Skin:  Negative for rash.       Hidradenitis as outlined.   Neurological:  Negative for dizziness and headaches.  Psychiatric/Behavioral:  Negative for agitation.        Increased stress as outlined.        Objective:     BP 124/70   Pulse 88   Temp 98.2 F (36.8 C)   Resp 16   Ht 5' 4.5" (1.638 m)   Wt 200 lb (90.7 kg)   SpO2 99%   BMI 33.80 kg/m  Wt Readings from Last 3 Encounters:  03/25/23 200 lb (90.7 kg)  12/21/22 203 lb (92.1 kg)  11/17/22 206 lb (93.4 kg)    Physical Exam Vitals reviewed.  Constitutional:      General: She is not in acute distress.    Appearance: Normal appearance.  HENT:     Head: Normocephalic and atraumatic.     Right Ear: External ear normal.     Left Ear: External ear normal.     Mouth/Throat:     Pharynx: No oropharyngeal exudate or posterior oropharyngeal erythema.  Eyes:     General: No scleral icterus.       Right eye: No discharge.        Left eye: No discharge.     Conjunctiva/sclera: Conjunctivae normal.  Neck:     Thyroid: No thyromegaly.  Cardiovascular:     Rate and Rhythm: Normal rate and regular rhythm.  Pulmonary:     Effort: No respiratory distress.     Breath sounds: Normal breath sounds. No wheezing.  Abdominal:     General: Bowel sounds are normal.     Palpations: Abdomen is soft.     Tenderness: There is no abdominal tenderness.  Musculoskeletal:        General: No swelling or tenderness.     Cervical back: Neck supple. No tenderness.  Lymphadenopathy:     Cervical: No cervical adenopathy.  Skin:    Findings: No erythema or rash.     Comments: Hidradenitis - axillary region - improved some from last check.  Neurological:     Mental Status: She is alert.  Psychiatric:        Mood and Affect: Mood normal.        Behavior: Behavior normal.      Outpatient Encounter Medications as of 03/25/2023  Medication  Sig   doxycycline (VIBRA-TABS) 100 MG tablet Take 1 tablet (100 mg total) by mouth 2 (two) times daily.   mupirocin ointment (BACTROBAN) 2 % Apply  to affected area bid   No facility-administered encounter medications on file as of 03/25/2023.     Lab Results  Component Value Date   WBC 12.0 (H) 09/10/2022   HGB 14.1 09/10/2022   HCT 42.2 09/10/2022   PLT 404 (H) 09/10/2022   GLUCOSE 122 (H) 06/28/2022   CHOL 193 06/28/2022   TRIG 119.0 06/28/2022   HDL 35.20 (L) 06/28/2022   LDLCALC 134 (H) 06/28/2022   ALT 39 (H) 07/12/2022   AST 43 (H) 07/12/2022   NA 135 07/01/2022   K 4.5 06/28/2022   CL 93 (L) 06/28/2022   CREATININE 0.46 06/28/2022   BUN 3 (L) 06/28/2022   CO2 26 06/28/2022   TSH 3.38 06/28/2022   HGBA1C  07/09/2008    5.0 (NOTE)   The ADA recommends the following therapeutic goal for glycemic   control related to Hgb A1C measurement:   Goal of Therapy:   < 7.0% Hgb A1C   Reference: American Diabetes Association: Clinical Practice   Recommendations 2008, Diabetes Care,  2008, 31:(Suppl 1).    US Abdomen Complete Result Date: 07/20/2022 CLINICAL DATA:  Abnormal LFTs. EXAM: ABDOMEN ULTRASOUND COMPLETE COMPARISON:  CT abdomen pelvis 08/16/2008 FINDINGS: Gallbladder: No gallstones or wall thickening visualized. No sonographic Murphy sign noted by sonographer. Common bile duct: Diameter: 1.8 mm Liver: Increased echogenicity. No focal lesion. Portal vein is patent on color Doppler imaging with normal direction of blood flow towards the liver. IVC: No abnormality visualized. Pancreas: Visualized portion unremarkable. Spleen: Size and appearance within normal limits. Right Kidney: Length: 12.6 cm. Echogenicity within normal limits. No mass or hydronephrosis visualized. Left Kidney: Length: 11.9 cm. Echogenicity within normal limits. No mass or hydronephrosis visualized. Abdominal aorta: No aneurysm visualized. Other findings: None. IMPRESSION: 1. Increased hepatic parenchymal  echogenicity suggestive of steatosis. 2. No cholelithiasis or sonographic evidence for acute cholecystitis. Electronically Signed   By: Annia Belt M.D.   On: 07/20/2022 16:55       Assessment & Plan:  Severe episode of recurrent major depressive disorder, with psychotic features (HCC) Assessment & Plan: Increased anxiety and stress as outlined.  Discussed relationship issues.  Discussed need for f/u with psychiatry. She declines.  She is seeing her therapist. Has appt this pm. Is doing better - with setting some boundaries, writing, etc. Assures me she would not hurt herself. Keep f/u with therapy.   Leukocytosis, unspecified type Assessment & Plan: Feel related to infection/hydradenitis. Seeing dermatology for treatment.    Hydradenitis Assessment & Plan: Seeing dermatology now.  Continue f/u with derm.    Anxiety Assessment & Plan: Increased stress. Discussed.  Seeing a therapist. Has appt this am. Have discussed psychiatry evaluation.  She continues to want to hold on referral.  Will notify if changes her mind.       Dale [place], MD

## 2023-03-28 ENCOUNTER — Encounter: Payer: Self-pay | Admitting: Internal Medicine

## 2023-03-28 DIAGNOSIS — L732 Hidradenitis suppurativa: Secondary | ICD-10-CM | POA: Diagnosis not present

## 2023-03-28 NOTE — Assessment & Plan Note (Signed)
Increased stress. Discussed.  Seeing a therapist. Has appt this am. Have discussed psychiatry evaluation.  She continues to want to hold on referral.  Will notify if changes her mind.

## 2023-03-28 NOTE — Assessment & Plan Note (Signed)
Feel related to infection/hydradenitis. Seeing dermatology for treatment.

## 2023-03-28 NOTE — Assessment & Plan Note (Signed)
Seeing dermatology now.  Continue f/u with derm.

## 2023-03-28 NOTE — Assessment & Plan Note (Signed)
Increased anxiety and stress as outlined.  Discussed relationship issues.  Discussed need for f/u with psychiatry. She declines.  She is seeing her therapist. Has appt this pm. Is doing better - with setting some boundaries, writing, etc. Assures me she would not hurt herself. Keep f/u with therapy.

## 2023-04-02 LAB — LAB REPORT - SCANNED: HM Hepatitis Screen: NEGATIVE

## 2023-04-05 ENCOUNTER — Encounter: Payer: Self-pay | Admitting: Internal Medicine

## 2023-04-07 NOTE — Telephone Encounter (Signed)
 Ok to schedule work in appt.

## 2023-04-07 NOTE — Telephone Encounter (Signed)
 Patient absolutely refuses to see psychiatry. Described psychiatrist as anus' with legs. Confirmed no suicidal thoughts. Pt states that she does not understand what the issue is with xanax  and valium because they seem to be more effective. Patient says she is extremely stressed out. Offered her a virtual visit to discuss, patient stated that she would like that. Patient also stated she id ok to wait until next week. Wanted to make sure ok with you before scheduling her?

## 2023-04-07 NOTE — Telephone Encounter (Signed)
 Please call her. Need more information.  She needs to be evaluated.  Confirm no suicidal thoughts, etc. Also, I have talked to her about the need to get established with a psychiatrist.  She has declined previously.  See if agreeable now.  Needs to be seen to determine best treatment.

## 2023-04-08 NOTE — Telephone Encounter (Signed)
 Pt scheduled

## 2023-04-14 ENCOUNTER — Telehealth (INDEPENDENT_AMBULATORY_CARE_PROVIDER_SITE_OTHER): Payer: Medicare HMO | Admitting: Internal Medicine

## 2023-04-14 ENCOUNTER — Encounter: Payer: Self-pay | Admitting: Internal Medicine

## 2023-04-14 VITALS — Ht 64.5 in | Wt 205.4 lb

## 2023-04-14 DIAGNOSIS — F419 Anxiety disorder, unspecified: Secondary | ICD-10-CM | POA: Diagnosis not present

## 2023-04-14 DIAGNOSIS — F333 Major depressive disorder, recurrent, severe with psychotic symptoms: Secondary | ICD-10-CM | POA: Diagnosis not present

## 2023-04-14 DIAGNOSIS — L732 Hidradenitis suppurativa: Secondary | ICD-10-CM

## 2023-04-14 MED ORDER — HYDROXYZINE HCL 10 MG PO TABS: ORAL_TABLET | ORAL | 0 refills | Status: DC

## 2023-04-14 NOTE — Progress Notes (Signed)
 Patient ID: Julie Hester, female   DOB: 08/28/1995, 28 y.o.   MRN: [id number]   Virtual Visit via video Note  I connected with Julie Hester by a video enabled telemedicine application and verified that I am speaking with the correct person using two identifiers. Location patient: home Location provider: work Persons participating in the virtual visit: patient, provider  The limitations, risks, security and privacy concerns of performing an evaluation and management service by video and the availability of in person appointments have been discussed. It has also been discussed with the patient that there may be a patient responsible charge related to this service. The patient expressed understanding and agreed to proceed.    Reason for visit: work in appt.   HPI: Work in regarding increased anxiety. Discussed persistent issues with increased anxiety. Some days better than others, but overall she feels her anxiety is worse. Increased stress related to relationship issues.  Discussed. She is seeing a counselor regularly. Discussed recommendation for psychiatry evaluation and follow up. Discussed treatment options. Discussed recommendation to avoid anxiolytics. She has been journaling, meditating and planning to exercise more.    ROS: See pertinent positives and negatives per HPI.  Past Medical History:  Diagnosis Date   Asthma    As a child   Attention deficit disorder    Hydradenitis     Past Surgical History:  Procedure Laterality Date   NO PAST SURGERIES      Family History  Problem Relation Age of Onset   Arthritis Mother    Diabetes Father    Cancer Maternal Grandmother        lung   Cancer Maternal Grandfather        lung   Cancer Paternal Grandmother        lung   Cancer Paternal Grandfather        lung    SOCIAL HX: reviewed.    Current Outpatient Medications:    hydrOXYzine  (ATARAX ) 10 MG tablet, Take 1-2 tablets q day prn., Disp: 30 tablet, Rfl: 0    mupirocin  ointment (BACTROBAN ) 2 %, Apply to affected area bid, Disp: 22 g, Rfl: 0  EXAM:  GENERAL: alert, oriented, appears well and in no acute distress  HEENT: atraumatic, conjunttiva clear, no obvious abnormalities on inspection of external nose and ears  NECK: normal movements of the head and neck  LUNGS: on inspection no signs of respiratory distress, breathing rate appears normal, no obvious gross SOB, gasping or wheezing  CV: no obvious cyanosis  PSYCH/NEURO: pleasant and cooperative, no obvious depression or anxiety, speech and thought processing grossly intact  ASSESSMENT AND PLAN:  Discussed the following assessment and plan:  Problem List Items Addressed This Visit     Anxiety - Primary   Increased stress and anxiety. Discussed.  Seeing a therapist. Have discussed psychiatry evaluation.  Agreeable for referral. Discussed treatment options.  Declines SSRI. Discussed recommendation to avoid anxiolytics. Trial of hydroxyzine  10mg .  Follow.  Call with update.       Relevant Medications   hydrOXYzine  (ATARAX ) 10 MG tablet   Hydradenitis   Seeing dermatology now.  Continue f/u with derm.       Major depressive disorder, recurrent (HCC)   Increased anxiety and stress as outlined.  Discussed relationship issues.  Discussed need for f/u with psychiatry. She is agreeable. She is seeing her therapist. No SI. Declines SSRI. Discussed recommendation to avoid anxiolytics. Trial of hydroxyzine .       Relevant Medications  hydrOXYzine  (ATARAX ) 10 MG tablet    Return if symptoms worsen or fail to improve.   I discussed the assessment and treatment plan with the patient. The patient was provided an opportunity to ask questions and all were answered. The patient agreed with the plan and demonstrated an understanding of the instructions.   The patient was advised to call back or seek an in-person evaluation if the symptoms worsen or if the condition fails to improve as  anticipated.   Allena Hamilton, MD

## 2023-04-15 NOTE — Telephone Encounter (Signed)
 Updated insurance card

## 2023-04-17 ENCOUNTER — Encounter: Payer: Self-pay | Admitting: Internal Medicine

## 2023-04-17 NOTE — Assessment & Plan Note (Signed)
 Increased stress and anxiety. Discussed.  Seeing a therapist. Have discussed psychiatry evaluation.  Agreeable for referral. Discussed treatment options.  Declines SSRI. Discussed recommendation to avoid anxiolytics. Trial of hydroxyzine  10mg .  Follow.  Call with update.

## 2023-04-17 NOTE — Assessment & Plan Note (Signed)
 Seeing dermatology now.  Continue f/u with derm.

## 2023-04-17 NOTE — Assessment & Plan Note (Signed)
 Increased anxiety and stress as outlined.  Discussed relationship issues.  Discussed need for f/u with psychiatry. She is agreeable. She is seeing her therapist. No SI. Declines SSRI. Discussed recommendation to avoid anxiolytics. Trial of hydroxyzine.

## 2023-05-03 ENCOUNTER — Encounter: Payer: Self-pay | Admitting: *Deleted

## 2023-05-09 NOTE — Telephone Encounter (Signed)
Given her ultrasound revealed fatty liver and given persistent increased liver function tests I would like for her to see a GI physician to follow and avoid problems down the road. If agreeable, can see if she has a preference of which GI MD she prefers to see.  Agree with diet, exercise and weight loss.

## 2023-05-10 NOTE — Telephone Encounter (Signed)
Further testing and w/up for fatty liver (assess scarring, etc).

## 2023-06-15 DIAGNOSIS — Z79899 Other long term (current) drug therapy: Secondary | ICD-10-CM | POA: Diagnosis not present

## 2023-06-15 DIAGNOSIS — L732 Hidradenitis suppurativa: Secondary | ICD-10-CM | POA: Diagnosis not present

## 2023-06-23 ENCOUNTER — Telehealth: Payer: Self-pay | Admitting: Internal Medicine

## 2023-06-23 NOTE — Telephone Encounter (Signed)
 Good afternoon,   Dr Lorin Picket will not be in the office the day of your scheduled visit, Friday 3-28.  Please call the office at [phone] and we will get you resheduled.   Thank you  [id number], please reschedule this patient's 3 mo follow-up visit. [facility]

## 2023-07-01 ENCOUNTER — Ambulatory Visit: Payer: Medicare HMO | Admitting: Internal Medicine

## 2023-08-14 ENCOUNTER — Encounter: Payer: Self-pay | Admitting: Internal Medicine

## 2023-09-28 DIAGNOSIS — L732 Hidradenitis suppurativa: Secondary | ICD-10-CM | POA: Diagnosis not present

## 2023-12-13 ENCOUNTER — Ambulatory Visit (INDEPENDENT_AMBULATORY_CARE_PROVIDER_SITE_OTHER): Admitting: *Deleted

## 2023-12-13 VITALS — Ht 64.5 in | Wt 194.0 lb

## 2023-12-13 DIAGNOSIS — Z0000 Encounter for general adult medical examination without abnormal findings: Secondary | ICD-10-CM

## 2023-12-13 NOTE — Progress Notes (Signed)
 Subjective:   Julie Hester is a 28 y.o. who presents for a Medicare Wellness preventive visit.  As a reminder, Annual Wellness Visits don't include a physical exam, and some assessments may be limited, especially if this visit is performed virtually. We may recommend an in-person follow-up visit with your provider if needed.  Visit Complete: Virtual I connected with  Julie Hester on 12/13/23 by a audio enabled telemedicine application and verified that I am speaking with the correct person using two identifiers.  Patient Location: Home  Provider Location: Home Office  I discussed the limitations of evaluation and management by telemedicine. The patient expressed understanding and agreed to proceed.  Vital Signs: Because this visit was a virtual/telehealth visit, some criteria may be missing or patient reported. Any vitals not documented were not able to be obtained and vitals that have been documented are patient reported.  VideoDeclined- This patient declined Librarian, academic. Therefore the visit was completed with audio only.  Persons Participating in Visit: Patient.  AWV Questionnaire: No: Patient Medicare AWV questionnaire was not completed prior to this visit.  Cardiac Risk Factors include: obesity (BMI >30kg/m2)     Objective:    Today's Vitals   12/13/23 1541 12/13/23 1543  Weight: 194 lb (88 kg)   Height: 5' 4.5 (1.638 m)   PainSc:  8    Body mass index is 32.79 kg/m.     12/13/2023    4:08 PM  Advanced Directives  Does Patient Have a Medical Advance Directive? No  Would patient like information on creating a medical advance directive? No - Patient declined    Current Medications (verified) Outpatient Encounter Medications as of 12/13/2023  Medication Sig   clindamycin (CLEOCIN T) 1 % external solution Apply topically as needed.   mupirocin  ointment (BACTROBAN ) 2 % Apply to affected area bid   hydrOXYzine  (ATARAX ) 10  MG tablet Take 1-2 tablets q day prn. (Patient not taking: Reported on 12/13/2023)   No facility-administered encounter medications on file as of 12/13/2023.    Allergies (verified) Ibuprofen   History: Past Medical History:  Diagnosis Date   Asthma    As a child   Attention deficit disorder    Hydradenitis    Past Surgical History:  Procedure Laterality Date   NO PAST SURGERIES     Family History  Problem Relation Age of Onset   Arthritis Mother    Diabetes Father    Cancer Maternal Grandmother        lung   Cancer Maternal Grandfather        lung   Cancer Paternal Grandmother        lung   Cancer Paternal Grandfather        lung   Social History   Socioeconomic History   Marital status: Single    Spouse name: Not on file   Number of children: 0   Years of education: Not on file   Highest education level: Not on file  Occupational History   Occupation: disabled  Tobacco Use   Smoking status: Every Day    Current packs/day: 0.50    Average packs/day: 0.5 packs/day for 8.7 years (4.3 ttl pk-yrs)    Types: Cigarettes    Start date: 2017   Smokeless tobacco: Never  Vaping Use   Vaping status: Never Used  Substance and Sexual Activity   Alcohol use: Yes    Alcohol/week: 28.0 standard drinks of alcohol    Types: 14  Glasses of wine, 14 Standard drinks or equivalent per week    Comment: 4-5 drinks per day   Drug use: Yes    Types: Marijuana    Comment: smokes 1-2 times per week, edibles 2-3 times per week   Sexual activity: Yes    Birth control/protection: Condom    Comment: sometimes use condoms  Other Topics Concern   Not on file  Social History Narrative   Disabled. Is engaged. No children   Social Drivers of Corporate investment banker Strain: Low Risk  (12/13/2023)   Overall Financial Resource Strain (CARDIA)    Difficulty of Paying Living Expenses: Not hard at all  Food Insecurity: No Food Insecurity (12/13/2023)   Hunger Vital Sign    Worried About  Running Out of Food in the Last Year: Never true    Ran Out of Food in the Last Year: Never true  Transportation Needs: No Transportation Needs (12/13/2023)   PRAPARE - Administrator, Civil Service (Medical): No    Lack of Transportation (Non-Medical): No  Physical Activity: Inactive (12/13/2023)   Exercise Vital Sign    Days of Exercise per Week: 0 days    Minutes of Exercise per Session: 0 min  Stress: Stress Concern Present (12/13/2023)   [employer] of Occupational Health - Occupational Stress Questionnaire    Feeling of Stress: Very much  Social Connections: Socially Isolated (12/13/2023)   Social Connection and Isolation Panel    Frequency of Communication with Friends and Family: More than three times a week    Frequency of Social Gatherings with Friends and Family: More than three times a week    Attends Religious Services: Never    Database administrator or Organizations: No    Attends Engineer, structural: Never    Marital Status: Never married    Tobacco Counseling Ready to quit: No Counseling given: Not Answered    Clinical Intake:  Pre-visit preparation completed: Yes  Pain : 0-10 Pain Score: 8  (doctor is aware of the issue) Pain Type: Chronic pain Pain Location: Arm (and groin pain) Pain Descriptors / Indicators: Nagging Pain Onset: More than a month ago Pain Frequency: Constant     BMI - recorded: 32.79 Nutritional Status: BMI > 30  Obese Nutritional Risks: Non-healing wound (has antibiotic to use, sees dermotologist) Diabetes: No  Lab Results  Component Value Date   HGBA1C  07/09/2008    5.0 (NOTE)   The ADA recommends the following therapeutic goal for glycemic   control related to Hgb A1C measurement:   Goal of Therapy:   < 7.0% Hgb A1C   Reference: American Diabetes Association: Clinical Practice   Recommendations 2008, Diabetes Care,  2008, 31:(Suppl 1).     How often do you need to have someone help you when you read  instructions, pamphlets, or other written materials from your doctor or pharmacy?: 1 - Never  Interpreter Needed?: No  Information entered by :: R. Lochlan Grygiel LPN   Activities of Daily Living     12/13/2023    3:50 PM  In your present state of health, do you have any difficulty performing the following activities:  Hearing? 0  Vision? 0  Difficulty concentrating or making decisions? 1  Walking or climbing stairs? 0  Dressing or bathing? 0  Doing errands, shopping? 1  Preparing Food and eating ? N  Using the Toilet? N  In the past six months, have you accidently leaked urine? N  Do you have problems with loss of bowel control? N  Managing your Medications? N  Managing your Finances? N  Housekeeping or managing your Housekeeping? N    Patient Care Team: Glendia Shad, MD as PCP - General (Internal Medicine) Isenstein, Arin L, MD (Dermatology)  I have updated your Care Teams any recent Medical Services you may have received from other providers in the past year.     Assessment:   This is a routine wellness examination for Julie Hester.  Hearing/Vision screen Hearing Screening - Comments:: No issues Vision Screening - Comments:: No correction   Goals Addressed             This Visit's Progress    Patient Stated       Wants to exercise more, weight loss and eat well        Depression Screen     12/13/2023    4:00 PM 04/14/2023    4:13 PM 03/25/2023    4:05 PM 11/17/2022    3:33 PM 07/19/2022   11:36 AM 06/21/2022    2:11 PM 08/04/2021    3:48 PM  PHQ 2/9 Scores  PHQ - 2 Score 6 6 6 6 6 6 3   PHQ- 9 Score 21 20 25 17 20 15 13     Fall Risk     12/13/2023    3:54 PM 11/17/2022    3:44 PM 08/04/2021    3:48 PM 08/28/2020    4:10 PM 03/22/2016    3:10 PM  Fall Risk   Falls in the past year? 1 1 0 0 No   Number falls in past yr: 0 0     Injury with Fall? 0 0     Risk for fall due to : History of fall(s);Impaired balance/gait History of fall(s) No Fall Risks    Follow up  Falls evaluation completed;Falls prevention discussed Falls prevention discussed;Falls evaluation completed Falls evaluation completed  Falls evaluation completed       Data saved with a previous flowsheet row definition    MEDICARE RISK AT HOME:  Medicare Risk at Home Any stairs in or around the home?: Yes If so, are there any without handrails?: Yes Home free of loose throw rugs in walkways, pet beds, electrical cords, etc?: Yes Adequate lighting in your home to reduce risk of falls?: Yes Life alert?: No Use of a cane, walker or w/c?: No Grab bars in the bathroom?: No Shower chair or bench in shower?: No Elevated toilet seat or a handicapped toilet?: No  TIMED UP AND GO:  Was the test performed?  No  Cognitive Function: 6CIT completed        12/13/2023    4:08 PM 11/17/2022    3:46 PM  6CIT Screen  What Year? 0 points 0 points  What month? 0 points 0 points  What time? 0 points 0 points  Count back from 20 0 points 0 points  Months in reverse 0 points 0 points  Repeat phrase 0 points 0 points  Total Score 0 points 0 points    Immunizations Immunization History  Administered Date(s) Administered   Influenza,inj,Quad PF,6+ Mos 02/07/2018    Screening Tests Health Maintenance  Topic Date Due   HIV Screening  Never done   DTaP/Tdap/Td (1 - Tdap) Never done   Pneumococcal Vaccine (1 of 2 - PCV) Never done   Hepatitis B Vaccines 19-59 Average Risk (1 of 3 - 19+ 3-dose series) Never done   HPV VACCINES (1 -  3-dose SCDM series) Never done   Influenza Vaccine  11/04/2023   Medicare Annual Wellness (AWV)  11/17/2023   Cervical Cancer Screening (Pap smear)  12/21/2023 (Originally 12/29/2014)   Hepatitis C Screening  Completed   Meningococcal B Vaccine  Aged Out   COVID-19 Vaccine  Discontinued    Health Maintenance Items Addressed: Discussed the need to update vaccines. Patient declines all vaccines except the flu vaccine.   Additional Screening:  Vision Screening:  Recommended annual ophthalmology exams for early detection of glaucoma and other disorders of the eye. Is the patient up to date with their annual eye exam?  No  Who is the provider or what is the name of the office in which the patient attends annual eye exams?  Patient does not have an eye doctor. Patient is going to check with her insurance company to find out if she has vision coverage. Patient was advised that she needs to schedule an eye appointment.  Dental Screening: Recommended annual dental exams for proper oral hygiene  Community Resource Referral / Chronic Care Management: CRR required this visit?  No   CCM required this visit?  No   Plan:    I have personally reviewed and noted the following in the patient's chart:   Medical and social history Use of alcohol, tobacco or illicit drugs  Current medications and supplements including opioid prescriptions. Patient is not currently taking opioid prescriptions. Functional ability and status Nutritional status Physical activity Advanced directives List of other physicians Hospitalizations, surgeries, and ER visits in previous 12 months Vitals Screenings to include cognitive, depression, and falls Referrals and appointments  In addition, I have reviewed and discussed with patient certain preventive protocols, quality metrics, and best practice recommendations. A written personalized care plan for preventive services as well as general preventive health recommendations were provided to patient.   Angeline Fredericks, LPN   [id number]   After Visit Summary: (MyChart) Due to this being a telephonic visit, the after visit summary with patients personalized plan was offered to patient via MyChart   Notes: Nothing significant to report at this time.

## 2023-12-13 NOTE — Patient Instructions (Signed)
 Julie Hester,  Thank you for taking the time for your Medicare Wellness Visit. I appreciate your continued commitment to your health goals. Please review the care plan we discussed, and feel free to reach out if I can assist you further.  Medicare recommends these wellness visits once per year to help you and your care team stay ahead of potential health issues. These visits are designed to focus on prevention, allowing your provider to concentrate on managing your acute and chronic conditions during your regular appointments.  Please note that Annual Wellness Visits do not include a physical exam. Some assessments may be limited, especially if the visit was conducted virtually. If needed, we may recommend a separate in-person follow-up with your provider.  Ongoing Care Seeing your primary care provider every 3 to 6 months helps us  monitor your health and provide consistent, personalized care.  Remember to update your flu vaccine and consider updating your other vaccines.  Make sure that you call and schedule an eye appointment.  Referrals If a referral was made during today's visit and you haven't received any updates within two weeks, please contact the referred provider directly to check on the status.  Recommended Screenings:  Health Maintenance  Topic Date Due   HIV Screening  Never done   DTaP/Tdap/Td vaccine (1 - Tdap) Never done   Pneumococcal Vaccine (1 of 2 - PCV) Never done   Hepatitis B Vaccine (1 of 3 - 19+ 3-dose series) Never done   HPV Vaccine (1 - 3-dose SCDM series) Never done   Flu Shot  11/04/2023   Pap Smear  12/21/2023*   Medicare Annual Wellness Visit  12/12/2024   Hepatitis C Screening  Completed   Meningitis B Vaccine  Aged Out   COVID-19 Vaccine  Discontinued  *Topic was postponed. The date shown is not the original due date.       12/13/2023    4:08 PM  Advanced Directives  Does Patient Have a Medical Advance Directive? No  Would patient like information  on creating a medical advance directive? No - Patient declined   Advance Care Planning is important because it: Ensures you receive medical care that aligns with your values, goals, and preferences. Provides guidance to your family and loved ones, reducing the emotional burden of decision-making during critical moments.  Vision: Annual vision screenings are recommended for early detection of glaucoma, cataracts, and diabetic retinopathy. These exams can also reveal signs of chronic conditions such as diabetes and high blood pressure.  Dental: Annual dental screenings help detect early signs of oral cancer, gum disease, and other conditions linked to overall health, including heart disease and diabetes.  Please see the attached documents for additional preventive care recommendations.

## 2024-01-06 DIAGNOSIS — L732 Hidradenitis suppurativa: Secondary | ICD-10-CM | POA: Diagnosis not present

## 2024-02-10 ENCOUNTER — Encounter: Payer: Self-pay | Admitting: Internal Medicine

## 2024-02-10 NOTE — Telephone Encounter (Signed)
Letter printed and placed in box.

## 2024-04-24 ENCOUNTER — Telehealth: Payer: Self-pay

## 2024-04-24 ENCOUNTER — Encounter: Payer: Self-pay | Admitting: Internal Medicine

## 2024-04-24 ENCOUNTER — Ambulatory Visit: Admitting: Nurse Practitioner

## 2024-04-24 NOTE — Telephone Encounter (Signed)
 Need to see her in person - especially given I have not seen her in over one year. Will work her in this week as scheduled. Regarding her chest pain, if recurs/persistent - recommend acute evaluation - ER.

## 2024-04-24 NOTE — Telephone Encounter (Signed)
 Called and relayed Dr. Freda message to pt. Pt asked writer Does Dr. Glendia realize how much stress I'm under? Writer using therapeutic communication asked pt what types of self care she can do when she is under a lot of stress, gave examples of reading or bubble baths anything that would aide in relaxation.  Pt reported that she liked to take bubble baths and doing her nails.  Writer reported that those were both great ideas of things to try until her appointment with Dr. Glendia.  Encouraged pt that if chest pressure/tickling worsened that she should go to ED for evaluation, pt verbalized understanding.  Reiterated pt's appointment 04/26/24 @ 1:30 with Dr. Glendia, pt verbalized understanding the repeated the day and time of appt.

## 2024-04-24 NOTE — Telephone Encounter (Signed)
 Please call and confirm pt doing ok and confirm she is planning to come to appt. Also confirm no SI.

## 2024-04-24 NOTE — Telephone Encounter (Signed)
 Patient denies any SI, her cat passed away on 04/28/24, was 31 years old and she is very upset by this.  Pt was holding the cat. Reports that she is crying a lot.  Is describing a chest pressure/tickling sensation.  Sensation is centralized to her chest is not traveling to arms or causing shortness of breath.  Pt does report that she had a panic attack/asthma attack a few days ago that lasted 5-10 minutes.  She did want me to let Dr. Glendia know that she has decreased her smoking and has had 5 cigarettes in the last 3-4 days.  Is requesting a medication to help her get through this difficult emotional time.

## 2024-04-24 NOTE — Telephone Encounter (Signed)
 Called Patient to reschedule her virtual visit with the NP to a in person visit with her PCP for being Extremely Stressed. Patient needs to be evaluated in person since she has not been seen since 03/25/2023. Patient states she needs to ask her Mother if she can come in on 04/26/24 at 1:30 so I hold on while she talks to someone then she comes back and wants medication to be called in and I explained that is what the visit is for and then she states she at least needs medication for the next few days until Thursday's appointment. Patient states she thought virtual visits was the 'New thing. I explained multiple times why she needs to be evaluated instead of medication just being called in and why she needs to see her PCP for this issue.

## 2024-04-26 ENCOUNTER — Ambulatory Visit (INDEPENDENT_AMBULATORY_CARE_PROVIDER_SITE_OTHER): Admitting: Internal Medicine

## 2024-04-26 ENCOUNTER — Encounter: Payer: Self-pay | Admitting: Internal Medicine

## 2024-04-26 ENCOUNTER — Ambulatory Visit: Payer: Self-pay | Admitting: Internal Medicine

## 2024-04-26 VITALS — BP 120/70 | HR 110 | Ht 64.5 in | Wt 191.8 lb

## 2024-04-26 DIAGNOSIS — R079 Chest pain, unspecified: Secondary | ICD-10-CM

## 2024-04-26 DIAGNOSIS — F419 Anxiety disorder, unspecified: Secondary | ICD-10-CM | POA: Diagnosis not present

## 2024-04-26 DIAGNOSIS — F333 Major depressive disorder, recurrent, severe with psychotic symptoms: Secondary | ICD-10-CM

## 2024-04-26 DIAGNOSIS — L732 Hidradenitis suppurativa: Secondary | ICD-10-CM | POA: Diagnosis not present

## 2024-04-26 DIAGNOSIS — R7989 Other specified abnormal findings of blood chemistry: Secondary | ICD-10-CM

## 2024-04-26 DIAGNOSIS — D72829 Elevated white blood cell count, unspecified: Secondary | ICD-10-CM

## 2024-04-26 DIAGNOSIS — D509 Iron deficiency anemia, unspecified: Secondary | ICD-10-CM

## 2024-04-26 DIAGNOSIS — Z13220 Encounter for screening for lipoid disorders: Secondary | ICD-10-CM

## 2024-04-26 DIAGNOSIS — D649 Anemia, unspecified: Secondary | ICD-10-CM

## 2024-04-26 DIAGNOSIS — E871 Hypo-osmolality and hyponatremia: Secondary | ICD-10-CM

## 2024-04-26 MED ORDER — DOXYCYCLINE HYCLATE 100 MG PO TABS: 100.0000 mg | ORAL_TABLET | Freq: Two times a day (BID) | ORAL | 0 refills | Status: AC

## 2024-04-26 NOTE — Progress Notes (Signed)
 "  Subjective:    Patient ID: Julie Hester, female    DOB: 1995/11/30, 29 y.o.   MRN: [id number]  Patient here for  Chief Complaint  Patient presents with   Anxiety    HPI Work in appt - increased stress/anxiety.  Her cat passed 04/16/24. Crying a lot. Feels anxious. Requesting something to help calm down. Has previously tried buspar , hydroxyzine . Has been on zoloft  previously. Denies SI. Does report some decreased appetite. Has cut down her smoking to 2 cigarettes per day. Drinking wine - has cut down to 4-5 glasses per day. She also reports a tickling/pressure sensation in her chest. Described a panic attack. Questioning if chest sensations were related to increased anxiety. No vomiting. No bowel change reported. Flares - hidradenitis - axillary region.    Past Medical History:  Diagnosis Date   Asthma    As a child   Attention deficit disorder    Hydradenitis    Past Surgical History:  Procedure Laterality Date   NO PAST SURGERIES     Family History  Problem Relation Age of Onset   Arthritis Mother    Diabetes Father    Cancer Maternal Grandmother        lung   Cancer Maternal Grandfather        lung   Cancer Paternal Grandmother        lung   Cancer Paternal Grandfather        lung   Social History   Socioeconomic History   Marital status: Single    Spouse name: Not on file   Number of children: 0   Years of education: Not on file   Highest education level: Not on file  Occupational History   Occupation: disabled  Tobacco Use   Smoking status: Every Day    Current packs/day: 0.50    Average packs/day: 0.5 packs/day for 9.1 years (4.5 ttl pk-yrs)    Types: Cigarettes    Start date: 2017   Smokeless tobacco: Never  Vaping Use   Vaping status: Never Used  Substance and Sexual Activity   Alcohol use: Yes    Alcohol/week: 28.0 standard drinks of alcohol    Types: 14 Glasses of wine, 14 Standard drinks or equivalent per week    Comment: 4-5 drinks per  day   Drug use: Yes    Types: Marijuana    Comment: smokes 1-2 times per week, edibles 2-3 times per week   Sexual activity: Yes    Birth control/protection: Condom    Comment: sometimes use condoms  Other Topics Concern   Not on file  Social History Narrative   Disabled. Is engaged. No children   Social Drivers of Health   Tobacco Use: High Risk (04/30/2024)   Patient History    Smoking Tobacco Use: Every Day    Smokeless Tobacco Use: Never    Passive Exposure: Not on file  Financial Resource Strain: Low Risk (12/13/2023)   Overall Financial Resource Strain (CARDIA)    Difficulty of Paying Living Expenses: Not hard at all  Food Insecurity: No Food Insecurity (12/13/2023)   Epic    Worried About Programme Researcher, Broadcasting/film/video in the Last Year: Never true    Ran Out of Food in the Last Year: Never true  Transportation Needs: No Transportation Needs (12/13/2023)   Epic    Lack of Transportation (Medical): No    Lack of Transportation (Non-Medical): No  Physical Activity: Inactive (12/13/2023)   Exercise Vital Sign  Days of Exercise per Week: 0 days    Minutes of Exercise per Session: 0 min  Stress: Stress Concern Present (12/13/2023)   [employer] of Occupational Health - Occupational Stress Questionnaire    Feeling of Stress: Very much  Social Connections: Socially Isolated (12/13/2023)   Social Connection and Isolation Panel    Frequency of Communication with Friends and Family: More than three times a week    Frequency of Social Gatherings with Friends and Family: More than three times a week    Attends Religious Services: Never    Database Administrator or Organizations: No    Attends Banker Meetings: Never    Marital Status: Never married  Depression (PHQ2-9): High Risk (04/26/2024)   Depression (PHQ2-9)    PHQ-2 Score: 22  Alcohol Screen: Low Risk (12/13/2023)   Alcohol Screen    Last Alcohol Screening Score (AUDIT): 4  Housing: Unknown (12/13/2023)   Epic    Unable  to Pay for Housing in the Last Year: No    Number of Times Moved in the Last Year: Not on file    Homeless in the Last Year: No  Utilities: Not At Risk (12/13/2023)   Epic    Threatened with loss of utilities: No  Health Literacy: Adequate Health Literacy (12/13/2023)   B1300 Health Literacy    Frequency of need for help with medical instructions: Never     Review of Systems  Constitutional:  Positive for appetite change. Negative for fever.  HENT:  Negative for congestion and sinus pressure.   Respiratory:  Negative for cough and shortness of breath.   Cardiovascular:  Negative for palpitations and leg swelling.       Chest discomfort/tickling/tingling sensation as outlined.   Gastrointestinal:  Negative for abdominal pain, diarrhea and vomiting.  Genitourinary:  Negative for difficulty urinating and dysuria.  Musculoskeletal:  Negative for joint swelling and myalgias.  Skin:        Hidradenitis - bilateral axilla.   Neurological:  Negative for dizziness and headaches.  Psychiatric/Behavioral:  Negative for agitation and dysphoric mood.        Objective:     BP 120/70   Pulse (!) 110   Ht 5' 4.5 (1.638 m)   Wt 191 lb 12.8 oz (87 kg)   SpO2 96%   BMI 32.41 kg/m  Wt Readings from Last 3 Encounters:  04/26/24 191 lb 12.8 oz (87 kg)  12/13/23 194 lb (88 kg)  04/14/23 205 lb 6.4 oz (93.2 kg)    Physical Exam Vitals reviewed.  Constitutional:      General: She is not in acute distress.    Appearance: Normal appearance.  HENT:     Head: Normocephalic and atraumatic.     Right Ear: External ear normal.     Left Ear: External ear normal.     Mouth/Throat:     Pharynx: No oropharyngeal exudate or posterior oropharyngeal erythema.  Eyes:     General: No scleral icterus.       Right eye: No discharge.        Left eye: No discharge.     Conjunctiva/sclera: Conjunctivae normal.  Neck:     Thyroid : No thyromegaly.  Cardiovascular:     Rate and Rhythm: Regular rhythm.      Comments: Ventricular rate - recheck 96  Pulmonary:     Effort: No respiratory distress.     Breath sounds: Normal breath sounds. No wheezing.  Abdominal:  General: Bowel sounds are normal.     Palpations: Abdomen is soft.     Tenderness: There is no abdominal tenderness.  Musculoskeletal:        General: No swelling or tenderness.     Cervical back: Neck supple. No tenderness.  Lymphadenopathy:     Cervical: No cervical adenopathy.  Skin:    Findings: No rash.     Comments: Increased erythema and draining - hydradinitis  - bilateral axilla. Also - noted - lower sacrum.   Neurological:     Mental Status: She is alert.  Psychiatric:        Mood and Affect: Mood normal.        Behavior: Behavior normal.         Outpatient Encounter Medications as of 04/26/2024  Medication Sig   doxycycline  (VIBRA -TABS) 100 MG tablet Take 1 tablet (100 mg total) by mouth 2 (two) times daily.   [DISCONTINUED] clindamycin (CLEOCIN T) 1 % external solution Apply topically as needed. (Patient not taking: Reported on 04/26/2024)   [DISCONTINUED] hydrOXYzine  (ATARAX ) 10 MG tablet Take 1-2 tablets q day prn. (Patient not taking: Reported on 04/26/2024)   [DISCONTINUED] mupirocin  ointment (BACTROBAN ) 2 % Apply to affected area bid (Patient not taking: Reported on 04/26/2024)   No facility-administered encounter medications on file as of 04/26/2024.     Lab Results  Component Value Date   WBC 16.1 (H) 04/26/2024   HGB 11.9 (L) 04/26/2024   HCT 35.7 (L) 04/26/2024   PLT 507.0 (H) 04/26/2024   GLUCOSE 118 (H) 04/26/2024   CHOL 168 04/26/2024   TRIG 146.0 04/26/2024   HDL 39.10 04/26/2024   LDLCALC 99 04/26/2024   ALT 31 04/26/2024   AST 49 (H) 04/26/2024   NA 134 (L) 04/26/2024   K 4.5 04/26/2024   CL 98 04/26/2024   CREATININE 0.37 (L) 04/26/2024   BUN 3 (L) 04/26/2024   CO2 26 04/26/2024   TSH 2.87 04/26/2024   HGBA1C  07/09/2008    5.0 (NOTE)   The ADA recommends the following  therapeutic goal for glycemic   control related to Hgb A1C measurement:   Goal of Therapy:   < 7.0% Hgb A1C   Reference: American Diabetes Association: Clinical Practice   Recommendations 2008, Diabetes Care,  2008, 31:(Suppl 1).    US  Abdomen Complete Result Date: 07/20/2022 CLINICAL DATA:  Abnormal LFTs. EXAM: ABDOMEN ULTRASOUND COMPLETE COMPARISON:  CT abdomen pelvis 08/16/2008 FINDINGS: Gallbladder: No gallstones or wall thickening visualized. No sonographic Murphy sign noted by sonographer. Common bile duct: Diameter: 1.8 mm Liver: Increased echogenicity. No focal lesion. Portal vein is patent on color Doppler imaging with normal direction of blood flow towards the liver. IVC: No abnormality visualized. Pancreas: Visualized portion unremarkable. Spleen: Size and appearance within normal limits. Right Kidney: Length: 12.6 cm. Echogenicity within normal limits. No mass or hydronephrosis visualized. Left Kidney: Length: 11.9 cm. Echogenicity within normal limits. No mass or hydronephrosis visualized. Abdominal aorta: No aneurysm visualized. Other findings: None. IMPRESSION: 1. Increased hepatic parenchymal echogenicity suggestive of steatosis. 2. No cholelithiasis or sonographic evidence for acute cholecystitis. Electronically Signed   By: Bard Moats M.D.   On: 07/20/2022 16:55       Assessment & Plan:  Chest pain, unspecified type Assessment & Plan: Described the intermittent sensation in her chest as outlined. No chest pain reported with increased activity or exertion. Breathing stable. EKG - SR/ST with non specific changes inferiorly. Discussed further cardiac w/up. Cardiology reviewed. No significant  change. Follow. Treat increased anxiety.   Orders: -     EKG 12-Lead -     Magnesium  Leukocytosis, unspecified type Assessment & Plan: Feel related to infection/hydradenitis. Has seen dermatology for treatment. Recheck cbc today.   Orders: -     CBC with Differential/Platelet  Abnormal  liver function tests -     Hepatic function panel -     TSH  Screening cholesterol level -     Lipid panel  Hyponatremia -     Basic metabolic panel with GFR  Anxiety Assessment & Plan: Increased anxiety/stress and some depression. Have discussed previous relationship stress. Cat just recently passed. Increased anxiety. Requesting something to help her relax. Has tried hydroxyzine  and buspar . Previously on zoloft . No SI. Discussed the need to decrease wine intake. Discussed the need for f/u with psychiatry. Discussed treatment with psychiatry. Will start effexor  XR 75mg  q day. Follow. Referral made to psychiatry.    Hydradenitis Assessment & Plan: Current flare. Doxycycline  as directed. Follow.    Iron deficiency anemia, unspecified iron deficiency anemia type Assessment & Plan: Documented history. Check cbc today.    Severe episode of recurrent major depressive disorder, with psychotic features (HCC) Assessment & Plan: Increased anxiety and stress as outlined. Discussed. No SI. Start effexor  XR 75mg  q day as outlined below. Refer to psychiatry. Needs to continue f/u with her therapist.    Other orders -     Doxycycline  Hyclate; Take 1 tablet (100 mg total) by mouth 2 (two) times daily.  Dispense: 14 tablet; Refill: 0     Allena Hamilton, MD "

## 2024-04-27 ENCOUNTER — Other Ambulatory Visit: Payer: Self-pay | Admitting: Internal Medicine

## 2024-04-27 DIAGNOSIS — F419 Anxiety disorder, unspecified: Secondary | ICD-10-CM

## 2024-04-27 LAB — CBC WITH DIFFERENTIAL/PLATELET
Basophils Absolute: 0.1 K/uL (ref 0.0–0.1)
Basophils Relative: 0.5 % (ref 0.0–3.0)
Eosinophils Absolute: 0.1 K/uL (ref 0.0–0.7)
Eosinophils Relative: 0.8 % (ref 0.0–5.0)
HCT: 35.7 % — ABNORMAL LOW (ref 36.0–46.0)
Hemoglobin: 11.9 g/dL — ABNORMAL LOW (ref 12.0–15.0)
Lymphocytes Relative: 9.2 % — ABNORMAL LOW (ref 12.0–46.0)
Lymphs Abs: 1.5 K/uL (ref 0.7–4.0)
MCHC: 33.3 g/dL (ref 30.0–36.0)
MCV: 96.7 fl (ref 78.0–100.0)
Monocytes Absolute: 1.2 K/uL — ABNORMAL HIGH (ref 0.1–1.0)
Monocytes Relative: 7.2 % (ref 3.0–12.0)
Neutro Abs: 13.3 K/uL — ABNORMAL HIGH (ref 1.4–7.7)
Neutrophils Relative %: 82.3 % — ABNORMAL HIGH (ref 43.0–77.0)
Platelets: 507 K/uL — ABNORMAL HIGH (ref 150.0–400.0)
RBC: 3.7 Mil/uL — ABNORMAL LOW (ref 3.87–5.11)
RDW: 13.4 % (ref 11.5–15.5)
WBC: 16.1 K/uL — ABNORMAL HIGH (ref 4.0–10.5)

## 2024-04-27 LAB — HEPATIC FUNCTION PANEL
ALT: 31 U/L (ref 3–35)
AST: 49 U/L — ABNORMAL HIGH (ref 5–37)
Albumin: 3.5 g/dL (ref 3.5–5.2)
Alkaline Phosphatase: 60 U/L (ref 39–117)
Bilirubin, Direct: 0.1 mg/dL (ref 0.1–0.3)
Total Bilirubin: 0.4 mg/dL (ref 0.2–1.2)
Total Protein: 8.2 g/dL (ref 6.0–8.3)

## 2024-04-27 LAB — LIPID PANEL
Cholesterol: 168 mg/dL (ref 28–200)
HDL: 39.1 mg/dL
LDL Cholesterol: 99 mg/dL (ref 10–99)
NonHDL: 128.69
Total CHOL/HDL Ratio: 4
Triglycerides: 146 mg/dL (ref 10.0–149.0)
VLDL: 29.2 mg/dL (ref 0.0–40.0)

## 2024-04-27 LAB — BASIC METABOLIC PANEL WITH GFR
BUN: 3 mg/dL — ABNORMAL LOW (ref 6–23)
CO2: 26 meq/L (ref 19–32)
Calcium: 9.5 mg/dL (ref 8.4–10.5)
Chloride: 98 meq/L (ref 96–112)
Creatinine, Ser: 0.37 mg/dL — ABNORMAL LOW (ref 0.40–1.20)
GFR: 135.42 mL/min
Glucose, Bld: 118 mg/dL — ABNORMAL HIGH (ref 70–99)
Potassium: 4.5 meq/L (ref 3.5–5.1)
Sodium: 134 meq/L — ABNORMAL LOW (ref 135–145)

## 2024-04-27 LAB — MAGNESIUM: Magnesium: 1.8 mg/dL (ref 1.5–2.5)

## 2024-04-27 LAB — TSH: TSH: 2.87 u[IU]/mL (ref 0.35–5.50)

## 2024-04-27 MED ORDER — VENLAFAXINE HCL ER 75 MG PO CP24: 75.0000 mg | ORAL_CAPSULE | Freq: Every day | ORAL | 1 refills | Status: AC

## 2024-04-27 NOTE — Telephone Encounter (Signed)
 Discussed with psychiatry. Recommendation is to start effexor XR 75mg  q day. I can sen in rx for effexor. Also, need to schedule a f/u appt with psychiatry. Would like to get her back in with psychiatry. She told me yesterday she would consider going.

## 2024-04-27 NOTE — Telephone Encounter (Signed)
 Rx sent in for effexor  XR. See phone message. Order placed for psychiatry referral.

## 2024-04-27 NOTE — Telephone Encounter (Signed)
 Called and spoke with pt.  She is willing to try Effexor  XR 75mg  Q day, medication pended for your review and approval.  Pt asked what the potential side effects and the copy would be, let pt know that it would be best to have that discussion with pharmacy. Pt verbalized that she did not have high hopes that this would work.  Writer thanked pt for being willing to try this new medication but to keep in mind that there was no quick fix and encouraged her patience.  Pt voiced that she is willing to see a psychiatrist but does not wish to see Dr. Chipper.  She is open to any office that Dr. Glendia suggests and requests that a referral be sent in.

## 2024-04-27 NOTE — Addendum Note (Signed)
 Addended by: GLENDIA ALLENA RAMAN on: 04/27/2024 04:16 PM   Modules accepted: Orders

## 2024-04-27 NOTE — Addendum Note (Signed)
 Addended by: SEBASTIAN KNEE on: 04/27/2024 04:13 PM   Modules accepted: Orders

## 2024-04-28 DIAGNOSIS — D649 Anemia, unspecified: Secondary | ICD-10-CM

## 2024-04-28 NOTE — Progress Notes (Signed)
 Order placed for future labs

## 2024-04-30 ENCOUNTER — Encounter: Payer: Self-pay | Admitting: Internal Medicine

## 2024-04-30 NOTE — Assessment & Plan Note (Signed)
 Increased anxiety and stress as outlined. Discussed. No SI. Start effexor  XR 75mg  q day as outlined below. Refer to psychiatry. Needs to continue f/u with her therapist.

## 2024-04-30 NOTE — Assessment & Plan Note (Signed)
 Current flare. Doxycycline  as directed. Follow.

## 2024-04-30 NOTE — Assessment & Plan Note (Signed)
 Increased anxiety/stress and some depression. Have discussed previous relationship stress. Cat just recently passed. Increased anxiety. Requesting something to help her relax. Has tried hydroxyzine  and buspar . Previously on zoloft . No SI. Discussed the need to decrease wine intake. Discussed the need for f/u with psychiatry. Discussed treatment with psychiatry. Will start effexor  XR 75mg  q day. Follow. Referral made to psychiatry.

## 2024-04-30 NOTE — Assessment & Plan Note (Signed)
 Feel related to infection/hydradenitis. Has seen dermatology for treatment. Recheck cbc today.

## 2024-04-30 NOTE — Assessment & Plan Note (Signed)
 Documented history. Check cbc today.

## 2024-04-30 NOTE — Assessment & Plan Note (Signed)
 Described the intermittent sensation in her chest as outlined. No chest pain reported with increased activity or exertion. Breathing stable. EKG - SR/ST with non specific changes inferiorly. Discussed further cardiac w/up. Cardiology reviewed. No significant change. Follow. Treat increased anxiety.

## 2024-05-07 ENCOUNTER — Ambulatory Visit: Admitting: Internal Medicine

## 2024-05-22 ENCOUNTER — Ambulatory Visit: Admitting: Internal Medicine

## 2024-12-17 ENCOUNTER — Ambulatory Visit
